# Patient Record
Sex: Female | Born: 1950 | Hispanic: No | Marital: Married | Smoking: Former smoker
Health system: Southern US, Community
[De-identification: ages and names within clinical notes are randomized; demographics above are authoritative.]

## PROBLEM LIST (undated history)

## (undated) DIAGNOSIS — L816 Other disorders of diminished melanin formation: Secondary | ICD-10-CM

## (undated) DIAGNOSIS — K635 Polyp of colon: Secondary | ICD-10-CM

## (undated) DIAGNOSIS — F329 Major depressive disorder, single episode, unspecified: Secondary | ICD-10-CM

## (undated) DIAGNOSIS — Z780 Asymptomatic menopausal state: Secondary | ICD-10-CM

## (undated) DIAGNOSIS — F32A Depression, unspecified: Secondary | ICD-10-CM

## (undated) DIAGNOSIS — M8580 Other specified disorders of bone density and structure, unspecified site: Secondary | ICD-10-CM

## (undated) DIAGNOSIS — IMO0002 Reserved for concepts with insufficient information to code with codable children: Secondary | ICD-10-CM

## (undated) DIAGNOSIS — R011 Cardiac murmur, unspecified: Secondary | ICD-10-CM

## (undated) DIAGNOSIS — F419 Anxiety disorder, unspecified: Secondary | ICD-10-CM

## (undated) HISTORY — DX: Asymptomatic menopausal state: Z78.0

## (undated) HISTORY — DX: Major depressive disorder, single episode, unspecified: F32.9

## (undated) HISTORY — DX: Other disorders of diminished melanin formation: L81.6

## (undated) HISTORY — DX: Cardiac murmur, unspecified: R01.1

## (undated) HISTORY — PX: COLONOSCOPY: SHX174

## (undated) HISTORY — PX: TONSILLECTOMY: SUR1361

## (undated) HISTORY — DX: Anxiety disorder, unspecified: F41.9

## (undated) HISTORY — DX: Other specified disorders of bone density and structure, unspecified site: M85.80

## (undated) HISTORY — PX: HEMORRHOID SURGERY: SHX153

## (undated) HISTORY — DX: Polyp of colon: K63.5

## (undated) HISTORY — DX: Reserved for concepts with insufficient information to code with codable children: IMO0002

## (undated) HISTORY — DX: Depression, unspecified: F32.A

---

## 1998-12-31 ENCOUNTER — Other Ambulatory Visit: Admission: RE | Admit: 1998-12-31 | Discharge: 1998-12-31 | Payer: Self-pay | Admitting: Gynecology

## 2000-02-13 ENCOUNTER — Other Ambulatory Visit: Admission: RE | Admit: 2000-02-13 | Discharge: 2000-02-13 | Payer: Self-pay | Admitting: Gynecology

## 2001-03-09 ENCOUNTER — Other Ambulatory Visit: Admission: RE | Admit: 2001-03-09 | Discharge: 2001-03-09 | Payer: Self-pay | Admitting: Gynecology

## 2002-04-12 ENCOUNTER — Other Ambulatory Visit: Admission: RE | Admit: 2002-04-12 | Discharge: 2002-04-12 | Payer: Self-pay | Admitting: Gynecology

## 2008-10-29 ENCOUNTER — Encounter: Admission: RE | Admit: 2008-10-29 | Discharge: 2008-10-29 | Payer: Self-pay | Admitting: Family Medicine

## 2009-12-09 LAB — LIPID PANEL: HDL: 92 mg/dL — AB (ref 35–70)

## 2011-03-22 LAB — POCT ERYTHROCYTE SEDIMENTATION RATE, NON-AUTOMATED: Sed Rate: 15 mm

## 2011-03-23 ENCOUNTER — Other Ambulatory Visit: Payer: Self-pay | Admitting: Family Medicine

## 2011-03-23 DIAGNOSIS — N189 Chronic kidney disease, unspecified: Secondary | ICD-10-CM

## 2011-03-26 ENCOUNTER — Ambulatory Visit
Admission: RE | Admit: 2011-03-26 | Discharge: 2011-03-26 | Disposition: A | Discharge status: Home / Self Care | Payer: BC Managed Care – PPO | Source: Ambulatory Visit | Attending: Family Medicine | Admitting: Family Medicine

## 2011-03-26 DIAGNOSIS — N189 Chronic kidney disease, unspecified: Secondary | ICD-10-CM

## 2011-03-27 ENCOUNTER — Other Ambulatory Visit: Payer: Self-pay

## 2011-10-15 ENCOUNTER — Ambulatory Visit (INDEPENDENT_AMBULATORY_CARE_PROVIDER_SITE_OTHER): Payer: BC Managed Care – PPO

## 2011-10-15 DIAGNOSIS — R1084 Generalized abdominal pain: Secondary | ICD-10-CM

## 2011-10-16 ENCOUNTER — Other Ambulatory Visit: Payer: Self-pay | Admitting: Family Medicine

## 2011-10-16 ENCOUNTER — Ambulatory Visit
Admission: RE | Admit: 2011-10-16 | Discharge: 2011-10-16 | Disposition: A | Discharge status: Home / Self Care | Payer: BC Managed Care – PPO | Source: Ambulatory Visit | Attending: Family Medicine | Admitting: Family Medicine

## 2011-10-16 ENCOUNTER — Ambulatory Visit (INDEPENDENT_AMBULATORY_CARE_PROVIDER_SITE_OTHER): Payer: BC Managed Care – PPO

## 2011-10-16 DIAGNOSIS — K921 Melena: Secondary | ICD-10-CM

## 2011-10-16 DIAGNOSIS — K5900 Constipation, unspecified: Secondary | ICD-10-CM

## 2011-10-16 DIAGNOSIS — K310 Acute dilatation of stomach: Secondary | ICD-10-CM

## 2011-10-16 LAB — CBC AND DIFFERENTIAL: WBC: 4.9 10^3/mL

## 2011-10-16 MED ORDER — IOHEXOL 300 MG/ML  SOLN
100.0000 mL | Freq: Once | INTRAMUSCULAR | Status: AC | PRN
  Administered 2011-10-16: 100 mL via INTRAVENOUS

## 2011-10-17 LAB — HEPATIC FUNCTION PANEL
ALT: 24 U/L (ref 7–35)
AST: 26 U/L (ref 13–35)

## 2011-10-17 LAB — BASIC METABOLIC PANEL: Potassium: 3.9 mmol/L (ref 3.4–5.3)

## 2011-10-26 ENCOUNTER — Ambulatory Visit
Admission: RE | Admit: 2011-10-26 | Discharge: 2011-10-26 | Disposition: A | Discharge status: Home / Self Care | Payer: BC Managed Care – PPO | Source: Ambulatory Visit | Attending: Gastroenterology | Admitting: Gastroenterology

## 2011-10-26 ENCOUNTER — Other Ambulatory Visit: Payer: Self-pay | Admitting: Gastroenterology

## 2011-10-26 DIAGNOSIS — A09 Infectious gastroenteritis and colitis, unspecified: Secondary | ICD-10-CM

## 2011-10-26 DIAGNOSIS — R9389 Abnormal findings on diagnostic imaging of other specified body structures: Secondary | ICD-10-CM

## 2011-11-05 ENCOUNTER — Encounter: Payer: Self-pay | Admitting: Family Medicine

## 2011-11-05 ENCOUNTER — Ambulatory Visit (INDEPENDENT_AMBULATORY_CARE_PROVIDER_SITE_OTHER): Payer: BC Managed Care – PPO | Admitting: Family Medicine

## 2011-11-05 DIAGNOSIS — B029 Zoster without complications: Secondary | ICD-10-CM | POA: Insufficient documentation

## 2011-11-05 DIAGNOSIS — S60212A Contusion of left wrist, initial encounter: Secondary | ICD-10-CM

## 2011-11-05 DIAGNOSIS — L816 Other disorders of diminished melanin formation: Secondary | ICD-10-CM | POA: Insufficient documentation

## 2011-11-05 DIAGNOSIS — Z780 Asymptomatic menopausal state: Secondary | ICD-10-CM | POA: Insufficient documentation

## 2011-11-05 DIAGNOSIS — H9319 Tinnitus, unspecified ear: Secondary | ICD-10-CM | POA: Insufficient documentation

## 2011-11-05 DIAGNOSIS — IMO0002 Reserved for concepts with insufficient information to code with codable children: Secondary | ICD-10-CM | POA: Insufficient documentation

## 2011-11-05 DIAGNOSIS — F419 Anxiety disorder, unspecified: Secondary | ICD-10-CM | POA: Insufficient documentation

## 2011-11-05 DIAGNOSIS — D72819 Decreased white blood cell count, unspecified: Secondary | ICD-10-CM

## 2011-11-05 DIAGNOSIS — F411 Generalized anxiety disorder: Secondary | ICD-10-CM

## 2011-11-05 DIAGNOSIS — Z888 Allergy status to other drugs, medicaments and biological substances status: Secondary | ICD-10-CM

## 2011-11-05 DIAGNOSIS — K635 Polyp of colon: Secondary | ICD-10-CM | POA: Insufficient documentation

## 2011-11-05 DIAGNOSIS — S60219A Contusion of unspecified wrist, initial encounter: Secondary | ICD-10-CM

## 2011-11-05 DIAGNOSIS — T50905A Adverse effect of unspecified drugs, medicaments and biological substances, initial encounter: Secondary | ICD-10-CM

## 2011-11-05 DIAGNOSIS — R011 Cardiac murmur, unspecified: Secondary | ICD-10-CM | POA: Insufficient documentation

## 2011-11-05 MED ORDER — SERTRALINE HCL 50 MG PO TABS: 50.0000 mg | ORAL_TABLET | Freq: Every day | ORAL | Status: DC

## 2011-11-05 NOTE — Patient Instructions (Signed)
Continue current medication. Follow-up with Dermatologist as planned to discuss possible side effect related to Plaquenil. Sertraline (Zoloft) has been refilled and routed to your pharmacy.   Wrist Pain Wrist injuries are frequent in adults and children. A sprain is an injury to the ligaments that hold your bones together. A strain is an injury to muscle or muscle cord-like structures (tendons) from stretching or pulling. Generally, when wrists are moderately tender to touch following a fall or injury, a break in the bone (fracture) may be present. Most wrist sprains or strains are better in 3 to 5 days, but complete healing may take several weeks. HOME CARE INSTRUCTIONS   Put ice on the injured area.   Put ice in a plastic bag.   Place a towel between your skin and the bag.   Leave the ice on for 15 to 20 minutes, 3 to 4 times a day, for the first 2 days.   Keep your arm raised above the level of your heart whenever possible to reduce swelling and pain.   Rest the injured area for at least 48 hours or as directed by your caregiver.   If a splint or elastic bandage has been applied, use it for as long as directed by your caregiver or until seen by a caregiver for a follow-up exam.   Only take over-the-counter or prescription medicines for pain, discomfort, or fever as directed by your caregiver.   Keep all follow-up appointments. You may need to follow up with a specialist or have follow-up X-rays. Improvement in pain level is not a guarantee that you did not fracture a bone in your wrist. The only way to determine whether or not you have a broken bone is by X-ray.  SEEK IMMEDIATE MEDICAL CARE IF:   Your fingers are swollen, very red, white, or cold and blue.   Your fingers are numb or tingling.   You have increasing pain.   You have difficulty moving your fingers.  MAKE SURE YOU:   Understand these instructions.   Will watch your condition.   Will get help right away if you  are not doing well or get worse.  Document Released: 07/01/2005 Document Revised: 06/03/2011 Document Reviewed: 11/12/2010 [facility] Patient Information 2012 [place], [place].

## 2011-11-05 NOTE — Progress Notes (Signed)
Subjective:    Patient ID: Rebecca [name], female    DOB: 06/09/1951, 61 y.o.   MRN: [id number]  HPI   This 12 y.Mar[place] female is here for follow-up re: Anxiety for which she takes Sertraline 50 mg             daily. This medication is working well for her in addition to counseling and better management of           time ( work-life balance). She no longer takes Alprazolam which she was using only for mild,        Intermittent insomnia. Her main concern now is with some GI problems that were initially         evaluated on 10/16/11 at [facility] (abd. pain and diarrhea) which the pt thinks is due to         Plaquenil which she takes for a skin disorder. She has since been to Dr. Ewing Schlein for GI follow-up        and has contacted her Dermatologist , Dr. Isaac Laud, via e-mail to clarify what her dosage         should be because she has reduced Plaquenil by half. She discontinued this med for several days        immediately after her 10/16/11 evaluation at [facility] and felt much better with almost complete        resolution of the abd pain and diarrhea. She has a recent print out of CBC done at recent visit        at [facility] and it shows a low WBC (3.9); her physician reassured her that this was not signi-        ficant but she is concerned about it and plans to review with Dermatologist.        The pt also had a fall a few days ago and has bruising and mild left wrist pain but doubts that she        has a fracture. She notes that she stretched out her arm to break her fall but did not seek medical        attention at the time.                  Review of Systems  Constitutional: Negative.   Cardiovascular: Negative.   Gastrointestinal: Negative.   Psychiatric/Behavioral: Negative.   See HPI re: Psychiatric details.     Objective:   Physical Exam  Vitals reviewed. Constitutional: She is oriented to person, place, and time. She appears well-developed and well-nourished.  HENT:  Head:  Normocephalic and atraumatic.  Pulmonary/Chest: Effort normal.  Musculoskeletal: Normal range of motion. She exhibits no edema and no tenderness.       Left wrist is discolored with bruising; nontender over bony structures. Grip is good.  Neurological: She is alert and oriented to person, place, and time.  Skin: Skin is warm and dry.       Hyperpigmentation with reticular pattern particularly in facial area is stable  Psychiatric: She has a normal mood and affect. Her behavior is normal. Judgment and thought content normal.   LABS:10/28/2011 at [facility]       WBC 3.9       Hgb   11.7    Hct   34.7       plts    191 K       CMET   Normal (K+= 3.7)      Assessment &  Plan:   1. Anxiety disorder - improved and stable. Continue Sertraline; RF x 4 months.  2. Leukopenia - pt given WBC values from prior visits at [facility]; to discuss with DERM.   3. Medication side effects present - suspect secondary to Plaquenil; symptoms improved when medication was held for few days. Pt resumed med at lower dose.  4. Contusion of wrist, left - pt instructions given.    RTC in 4 months or sooner prn.

## 2011-11-06 ENCOUNTER — Encounter: Payer: Self-pay | Admitting: Family Medicine

## 2011-11-12 ENCOUNTER — Telehealth: Payer: Self-pay

## 2011-11-12 NOTE — Telephone Encounter (Signed)
.  [facility] BARBARA FROM DR MAGOOD'S OFFICE STATES PT WAS TO HAVE A COLONOSPY DONE AND SHE CANCELLED.   Bonita Quin MAY REACH BARBARA AT [phone] IF NEEDED

## 2011-11-12 NOTE — Telephone Encounter (Signed)
We will wait for the patient to reschedule her colonoscopy since she says she is planning on doing so.

## 2011-11-12 NOTE — Telephone Encounter (Signed)
SPOKE WITH BARBARA, PT CANCELLED COLONOSCOPY AND DIDN'T GIVE REASON WHY. CALLED PT AND SHE STATES SHE HAS TOO MUCH GOING ON AND THE MEDS ARE CAUSING HER A LOT OF GI UPSET. SHE DOESN'T WANT TO GO THROUGH THE PREP RIGHT NOW BUT PLANS TO RESCHEDULE. FYI TO DR HOPPER

## 2012-01-29 ENCOUNTER — Ambulatory Visit (INDEPENDENT_AMBULATORY_CARE_PROVIDER_SITE_OTHER): Payer: BC Managed Care – PPO | Admitting: Family Medicine

## 2012-01-29 VITALS — BP 111/72 | HR 83 | Temp 97.2°F | Resp 16 | Ht 68.0 in | Wt 143.8 lb

## 2012-01-29 DIAGNOSIS — N760 Acute vaginitis: Secondary | ICD-10-CM

## 2012-01-29 DIAGNOSIS — G4700 Insomnia, unspecified: Secondary | ICD-10-CM

## 2012-01-29 LAB — POCT WET PREP WITH KOH
KOH Prep POC: NEGATIVE
Trichomonas, UA: NEGATIVE

## 2012-01-29 MED ORDER — ALPRAZOLAM 0.25 MG PO TABS: ORAL_TABLET | ORAL | Status: DC

## 2012-01-29 MED ORDER — METRONIDAZOLE 0.75 % VA GEL: VAGINAL | Status: DC

## 2012-01-29 NOTE — Patient Instructions (Signed)
Bacterial Vaginosis Bacterial vaginosis (BV) is a vaginal infection where the normal balance of bacteria in the vagina is disrupted. The normal balance is then replaced by an overgrowth of certain bacteria. There are several different kinds of bacteria that can cause BV. BV is the most common vaginal infection in women of childbearing age. CAUSES   The cause of BV is not fully understood. BV develops when there is an increase or imbalance of harmful bacteria.   Some activities or behaviors can upset the normal balance of bacteria in the vagina and put women at increased risk including:   Having a new sex partner or multiple sex partners.   Douching.   Using an intrauterine device (IUD) for contraception.   It is not clear what role sexual activity plays in the development of BV. However, women that have never had sexual intercourse are rarely infected with BV.  Women do not get BV from toilet seats, bedding, swimming pools or from touching objects around them.  SYMPTOMS   Grey vaginal discharge.   A fish-like odor with discharge, especially after sexual intercourse.   Itching or burning of the vagina and vulva.   Burning or pain with urination.   Some women have no signs or symptoms at all.  DIAGNOSIS  Your caregiver must examine the vagina for signs of BV. Your caregiver will perform lab tests and look at the sample of vaginal fluid through a microscope. They will look for bacteria and abnormal cells (clue cells), a pH test higher than 4.5, and a positive amine test all associated with BV.  RISKS AND COMPLICATIONS   Pelvic inflammatory disease (PID).   Infections following gynecology surgery.   Developing HIV.   Developing herpes virus.  TREATMENT  Sometimes BV will clear up without treatment. However, all women with symptoms of BV should be treated to avoid complications, especially if gynecology surgery is planned. Female partners generally do not need to be treated. However,  BV may spread between female sex partners so treatment is helpful in preventing a recurrence of BV.   BV may be treated with antibiotics. The antibiotics come in either pill or vaginal cream forms. Either can be used with nonpregnant or pregnant women, but the recommended dosages differ. These antibiotics are not harmful to the baby.   BV can recur after treatment. If this happens, a second round of antibiotics will often be prescribed.   Treatment is important for pregnant women. If not treated, BV can cause a premature delivery, especially for a pregnant woman who had a premature birth in the past. All pregnant women who have symptoms of BV should be checked and treated.   For chronic reoccurrence of BV, treatment with a type of prescribed gel vaginally twice a week is helpful.  HOME CARE INSTRUCTIONS   Finish all medication as directed by your caregiver.   Do not have sex until treatment is completed.   Tell your sexual partner that you have a vaginal infection. They should see their caregiver and be treated if they have problems, such as a mild rash or itching.   Practice safe sex. Use condoms. Only have 1 sex partner.  PREVENTION  Basic prevention steps can help reduce the risk of upsetting the natural balance of bacteria in the vagina and developing BV:  Do not have sexual intercourse (be abstinent).   Do not douche.   Use all of the medicine prescribed for treatment of BV, even if the signs and symptoms go away.     Tell your sex partner if you have BV. That way, they can be treated, if needed, to prevent reoccurrence.  SEEK MEDICAL CARE IF:   Your symptoms are not improving after 3 days of treatment.   You have increased discharge, pain, or fever.  MAKE SURE YOU:   Understand these instructions.   Will watch your condition.   Will get help right away if you are not doing well or get worse.  FOR MORE INFORMATION  Division of STD Prevention (DSTDP), Centers for Disease  Control and Prevention: www.cdc.gov/std American Social Health Association (ASHA): www.ashastd.org  Document Released: 09/21/2005 Document Revised: 09/10/2011 Document Reviewed: 03/14/2009 [employer] Patient Information 2012 [employer]. 

## 2012-01-29 NOTE — Progress Notes (Signed)
  Subjective:    Patient ID: Rebecca [name], female    DOB: 1951/03/18, 61 y.o.   MRN: [id number]  HPI  This pleasant 61 y.o. Woman is here to have vag discharge evaluated. She had been on chronic   meds for skin condition but found that it caused significant GI problems so she stopped it about 2 months  ago. She will f/u with the specialist responsible for prescribing Plaquenil next months. She also stopped   taking Sertraline (weaned off by taking 1 tab every other day) about 1 month ago. She feel good; still  having some stressors with family and work. She is still seeing a psychologist and learning to set boundaries and say "No"  to prevent being overwhelmed. She c/o insomnia with early awakenings at 3 AM and trouble getting back to sleep.  She is flying out to [place] in AM to help brother with some problems and is concerned about lack of sleep.   Review of Systems As per HPI     Objective:   Physical Exam  Vitals reviewed. Constitutional: She is oriented to person, place, and time. She appears well-developed and well-nourished. No distress.  HENT:  Head: Normocephalic and atraumatic.  Cardiovascular: Normal rate.   Pulmonary/Chest: Effort normal. No respiratory distress.  Genitourinary: Vaginal discharge found.  Neurological: She is alert and oriented to person, place, and time. No cranial nerve deficit.  Psychiatric: She has a normal mood and affect. Her behavior is normal. Judgment and thought content normal.    Results for orders placed in visit on 01/29/12  POCT WET PREP WITH KOH      Component Value Range   Trichomonas, UA Negative     Clue Cells Wet Prep HPF POC neg     Epithelial Wet Prep HPF POC 0-10     Yeast Wet Prep HPF POC neg     Bacteria Wet Prep HPF POC 1+     RBC Wet Prep HPF POC 0-2     WBC Wet Prep HPF POC 0-2     KOH Prep POC Negative           Assessment & Plan:   1. Insomnia  ALPRAZolam (XANAX) 0.25 MG tablet  1/2 to 1 tab hs as needed for sleep.   2. Vaginitis  RX:  Metrogel vag   Use as directed   Pt given copy of Anti-Inflammatory Diet; Follow-up with DERM at [facility] in May 2013

## 2012-01-30 ENCOUNTER — Encounter: Payer: Self-pay | Admitting: Family Medicine

## 2012-04-23 ENCOUNTER — Ambulatory Visit (INDEPENDENT_AMBULATORY_CARE_PROVIDER_SITE_OTHER): Payer: BC Managed Care – PPO | Admitting: Family Medicine

## 2012-04-23 VITALS — BP 122/74 | HR 72 | Temp 98.8°F | Resp 16 | Ht 68.0 in | Wt 146.0 lb

## 2012-04-23 DIAGNOSIS — G4700 Insomnia, unspecified: Secondary | ICD-10-CM

## 2012-04-23 DIAGNOSIS — F411 Generalized anxiety disorder: Secondary | ICD-10-CM

## 2012-04-23 DIAGNOSIS — F419 Anxiety disorder, unspecified: Secondary | ICD-10-CM

## 2012-04-23 DIAGNOSIS — N949 Unspecified condition associated with female genital organs and menstrual cycle: Secondary | ICD-10-CM

## 2012-04-23 DIAGNOSIS — N898 Other specified noninflammatory disorders of vagina: Secondary | ICD-10-CM

## 2012-04-23 LAB — POCT WET PREP WITH KOH
KOH Prep POC: NEGATIVE
Trichomonas, UA: NEGATIVE
Yeast Wet Prep HPF POC: NEGATIVE

## 2012-04-23 MED ORDER — ALPRAZOLAM 0.25 MG PO TABS: 0.2500 mg | ORAL_TABLET | Freq: Three times a day (TID) | ORAL | Status: DC | PRN

## 2012-04-23 MED ORDER — FLUOXETINE HCL 20 MG PO TABS: 20.0000 mg | ORAL_TABLET | Freq: Every day | ORAL | Status: DC

## 2012-04-23 NOTE — Progress Notes (Signed)
  Subjective:    Patient ID: Rebecca [name], female    DOB: [birth year], 61 y.o.   MRN: [id number]  HPI Rebecca [name] is a 61 y.o. female  Vaginal discharge - treated with Metrogel in April. Seemed to work, but came back with discharge - past 1 -2 months. No itching, no burning.  Darker color.  Slight odor.  Menopause in mid 50's.  No postmenopausal bleeding.  TX: none.  No recent antibiotics.  Sexually active - same partner for 30 years.  Last pap normal recently?   Anxiety - on Zoloft 50mg  QD. Started in October 2012. 3 pound weight gain.  Felt better in February - stopped on own. Has not been on this since February.  Feels anxious with issues recently, had seen therapist. Getting back to see therapist.  Doesn't want to slip back into depression. No suicide thoughts. More anxious than depressed.   Needs rx for bane scan at time of mammogram - last one 2 years ago?   [facility].    Review of Systems     Objective:   Physical Exam  Constitutional: She appears well-developed and well-nourished.  HENT:  Head: Normocephalic and atraumatic.  Neck: No thyromegaly present.  Cardiovascular: Normal rate, regular rhythm, normal heart sounds and intact distal pulses.   Pulmonary/Chest: Effort normal.  Abdominal: Soft. There is no tenderness.  Genitourinary: Pelvic exam was performed with patient supine. There is no rash or lesion on the right labia. There is no rash or lesion on the left labia. No bleeding around the vagina. No foreign body around the vagina. Vaginal discharge (small amount of white discharge in vault. ) found.  Skin: Skin is warm and dry.  Psychiatric: She has a normal mood and affect. Her behavior is normal. Judgment and thought content normal. She expresses no suicidal ideation.     Results for orders placed in visit on 04/23/12  POCT WET PREP WITH KOH      Component Value Range   Trichomonas, UA Negative     Clue Cells Wet Prep HPF POC neg     Epithelial Wet Prep HPF POC 4-6     Yeast Wet Prep HPF POC neg     Bacteria Wet Prep HPF POC trace     RBC Wet Prep HPF POC neg     WBC Wet Prep HPF POC 2-8     KOH Prep POC Negative          Assessment & Plan:  Rebecca [name] is a 61 y.o. female  Vaginal discharge - likely physiologic.  Reassurance provided.  Can recheck in few weeks with primary provider, sooner if worsening.   Anxiety - with hx depression, early recurrence of meds.  Encouraged to restart counselling.  Will try prozac 20mg  qd as change in meds requested. SED. Xanax 0.25mg  tid prn temporarily. Needs to recheck in next 3 weeks with Dr. Audria Nine.    Paper Rx for bone density testing provided, but may need to check with insurance to determine if this is covered.  Also encouraged CPE with primary provider.

## 2012-04-23 NOTE — Patient Instructions (Signed)
Recheck with Dr. Audria Nine in few weeks. Return to the clinic or go to the nearest emergency room if any of your symptoms worsen or new symptoms occur. Check with [facility] and possibly your insurance company to make sure the bone density test is covered.

## 2012-05-11 ENCOUNTER — Ambulatory Visit (INDEPENDENT_AMBULATORY_CARE_PROVIDER_SITE_OTHER): Payer: BC Managed Care – PPO | Admitting: Family Medicine

## 2012-05-11 VITALS — BP 133/77 | HR 66 | Temp 98.1°F | Resp 16 | Ht 68.0 in | Wt 145.0 lb

## 2012-05-11 DIAGNOSIS — N951 Menopausal and female climacteric states: Secondary | ICD-10-CM

## 2012-05-11 DIAGNOSIS — F341 Dysthymic disorder: Secondary | ICD-10-CM

## 2012-05-11 DIAGNOSIS — L816 Other disorders of diminished melanin formation: Secondary | ICD-10-CM

## 2012-05-11 DIAGNOSIS — F418 Other specified anxiety disorders: Secondary | ICD-10-CM

## 2012-05-11 DIAGNOSIS — L819 Disorder of pigmentation, unspecified: Secondary | ICD-10-CM

## 2012-05-11 MED ORDER — VENLAFAXINE HCL 37.5 MG PO TABS: 37.5000 mg | ORAL_TABLET | Freq: Two times a day (BID) | ORAL | Status: DC

## 2012-05-12 NOTE — Progress Notes (Signed)
S; This 61 y.o. Female has depression and anxiety treated with Fluoxetine. She also has menopause symptoms- hot flushing and night sweats as well as exacerbation of depressive symptoms. She takes generic Prozac as prescribed by Dr Herma Carson last month and uses Alprazolam only as needed. She was seen at [facility] last month for evaluation of vaginal discharge and ongoing depression was addressed. She is in counseling and continues to address issues pertaining to management of work-related stress and familial obligations. She also continues to see Derm specialist at [facility] and takes prescribed medication but does not see any significant improvement in facial skin appearance. She is feeling a little disheartened. She has no thoughts of self-harm; husband is very supportive.  ROS: As per HPI otherwise, noncontributory.   O:  Filed Vitals:   05/11/12 1041  BP: 133/77  Pulse: 66  Temp: 98.1 F (36.7 C)  Resp: 16   GEN: In NAD; WN,WD. HENT: [place]/AT; EOMI; conj/sclerae clear. NECK: No thyromegaly COR: RRR LUNGS: Normal resp rate and effort. NEURO: A & O x 3; otherwise nonfocal; mental status- pt has a pleasant demeanor an does not appear anxious or depressed. SKIN: Facial area- diffuse spotchy hyperpigmentation   A/P:     1. Depression with anxiety - continue counseling; discontinue Fluoxetine. Pt willing to try generic Effexor 37.5 mg- maintenance dose will be 1 tablet twice a day until follow-up  2. 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

## 2012-05-24 ENCOUNTER — Encounter: Payer: Self-pay | Admitting: Family Medicine

## 2012-06-22 ENCOUNTER — Ambulatory Visit (INDEPENDENT_AMBULATORY_CARE_PROVIDER_SITE_OTHER): Payer: BC Managed Care – PPO | Admitting: Family Medicine

## 2012-06-22 ENCOUNTER — Encounter: Payer: Self-pay | Admitting: Family Medicine

## 2012-06-22 VITALS — BP 118/74 | HR 87 | Temp 97.4°F | Resp 16 | Ht 68.0 in | Wt 145.0 lb

## 2012-06-22 DIAGNOSIS — Z780 Asymptomatic menopausal state: Secondary | ICD-10-CM

## 2012-06-22 DIAGNOSIS — F419 Anxiety disorder, unspecified: Secondary | ICD-10-CM

## 2012-06-22 DIAGNOSIS — F411 Generalized anxiety disorder: Secondary | ICD-10-CM

## 2012-06-22 DIAGNOSIS — N952 Postmenopausal atrophic vaginitis: Secondary | ICD-10-CM

## 2012-06-22 MED ORDER — VENLAFAXINE HCL 37.5 MG PO TABS: 37.5000 mg | ORAL_TABLET | Freq: Two times a day (BID) | ORAL | Status: DC

## 2012-06-22 MED ORDER — ESTROGENS, CONJUGATED 0.625 MG/GM VA CREA: TOPICAL_CREAM | VAGINAL | Status: DC

## 2012-06-22 NOTE — Progress Notes (Signed)
S:  This 61 y.o. Menopausal female returns today for follow-up after medication change (Fluoxetine discontinued and Venlafaxine started). Pt reports that she feels better ("calmer"), more focused and resting better with ~ 50% less hot flushes. She feels like she is in a better place and state of mind; she also realizes that she was having an alcoholic beverage (vodka) every PM after work and decided to discontinue this in Nov 2012. She now has 1 glass of wine with meals on the weekend. She discussed her alcohol use with her counselor who advised her about deleterious effects of alcohol of women as compared to men; she is aware that she was probably using alcohol to "self-medicate". Her family hx includes alcohol dependence.  She feels like she has reached maximum benefit from counseling at this point but can call her therapist  If she needs to return. She feels that she is managing work and stress better. Husband is very supportive; he has been "bugging" her to get a full physical exam.   She continues to have vaginal dryness and OTC lubricants are not effective. She has been reading about Premarin vaginal cream and would like to try this product. She wants to avoid the systemic effect of HRT.  ROS: As per HPI; no CP or tightness, HAs, SOB, abd pain or jaundice, vaginal bleeding or discharge.   OCeasar Mons Vitals:   06/22/12 0805  BP: 118/74  Pulse: 87  Temp: 97.4 F (36.3 C)  Resp: 16   GEN: In NAD; WN,WD. HENT: [place]/AT; EOMI, conj/scl clear and nonicteric. COR: RRR. LUNGS: Normal resp rate and effort. SKIN: W&D; no rashes, paleness or jaundice; hyperpigmentation again noted on facial area. NEURO: A&O x 3; CNs intact; Mentation- very pleasant demeanor with happy affect; attentive and engaged in conversation.   A/P: 1. Anxiety   Great results with Venlafaxine 37.5 mg bid; pt is content to remain at this dose Continue to use Alprazolam as needed hs for sleep.  2. Menopause  Continue  Venlafaxine and reduced alcohol intake  3. Postmenopause atrophic vaginitis    RX: Premarin vag cream  1/4 to 1/2 applicatorful hs

## 2012-12-28 ENCOUNTER — Ambulatory Visit: Payer: BC Managed Care – PPO | Admitting: Family Medicine

## 2013-01-01 ENCOUNTER — Ambulatory Visit: Payer: BC Managed Care – PPO | Admitting: Emergency Medicine

## 2013-01-01 VITALS — BP 130/85 | HR 82 | Temp 98.0°F | Resp 16 | Ht 68.0 in | Wt 149.8 lb

## 2013-01-01 DIAGNOSIS — F4323 Adjustment disorder with mixed anxiety and depressed mood: Secondary | ICD-10-CM

## 2013-01-01 DIAGNOSIS — Z780 Asymptomatic menopausal state: Secondary | ICD-10-CM

## 2013-01-01 MED ORDER — VENLAFAXINE HCL 37.5 MG PO TABS: ORAL_TABLET | ORAL | Status: DC

## 2013-01-01 NOTE — Progress Notes (Signed)
[facility] [address], [place] [address] [phone]  Date:  01/01/2013   Name:  Rebecca [name]   DOB:  10-23-1950   MRN:  [id number]  PCP:  No primary provider on file.    Chief Complaint: Medication Refill   History of Present Illness:  Rebecca [name] is a 62 y.o. very pleasant female patient who presents with the following:  History of depression and anxiety disorder and has increased symptoms.  She increased her effexor dose from 75 to 112.5 (extra 37.5) as she was having increased night sweats.  She is now unable to refill her medication as she has run out early.  No improvement with over the counter medications or other home remedies. Denies other complaint or health concern today.   Patient Active Problem List  Diagnosis  . Menopause  . Murmur  . Poikiloderma  . Herpes zoster  . Degenerative disk disease  . Tinnitus  . Colon polyp  . Anxiety disorder    Past Medical History  Diagnosis Date  . Menopause   . Murmur   . Osteopenia   . Poikiloderma   . Herpes zoster     2011  . Degenerative disk disease     C5-C6  . Tinnitus     L ear  . Colon polyp     2007    Past Surgical History  Procedure Laterality Date  . Hemorrhoid surgery      History  Substance Use Topics  . Smoking status: Former Smoker -- 1.00 packs/day    Types: Cigarettes    Quit date: 11/04/1978  . Smokeless tobacco: Not on file  . Alcohol Use: Not on file    History reviewed. No pertinent family history.  Allergies  Allergen Reactions  . Codeine Hives    Medication list has been reviewed and updated.  Current Outpatient Prescriptions on File Prior to Visit  Medication Sig Dispense Refill  . ALPRAZolam (XANAX) 0.25 MG tablet Take 1 tablet (0.25 mg total) by mouth 3 (three) times daily as needed for sleep or anxiety.  30 tablet  0  . conjugated estrogens (PREMARIN) vaginal cream Use 1/4 to 1/2 applicatorful hs  42.5 g  5  . venlafaxine (EFFEXOR) 37.5 MG  tablet Take 1 tablet (37.5 mg total) by mouth 2 (two) times daily.  60 tablet  5   No current facility-administered medications on file prior to visit.    Review of Systems:  As per HPI, otherwise negative.    Physical Examination: Filed Vitals:   01/01/13 0908  BP: 130/85  Pulse: 82  Temp: 98 F (36.7 C)  Resp: 16   Filed Vitals:   01/01/13 0908  Height: 5\' 8"  (1.727 m)  Weight: 149 lb 12.8 oz (67.949 kg)   Body mass index is 22.78 kg/(m^2). Ideal Body Weight: Weight in (lb) to have BMI = 25: 164.1   GEN: WDWN, NAD, Non-toxic, Alert & Oriented x 3 HEENT: Atraumatic, Normocephalic.  Ears and Nose: No external deformity. EXTR: No clubbing/cyanosis/edema NEURO: Normal gait.  PSYCH: Normally interactive. Conversant. Not depressed or anxious appearing.  Calm demeanor.    Assessment and Plan: Depression and anxiety disorder Menopause Refill med Follow up this week with Dr Audria Nine   Signed,  Phillips Odor, MD

## 2013-01-04 ENCOUNTER — Encounter: Payer: Self-pay | Admitting: Family Medicine

## 2013-01-04 ENCOUNTER — Ambulatory Visit (INDEPENDENT_AMBULATORY_CARE_PROVIDER_SITE_OTHER): Payer: BC Managed Care – PPO | Admitting: Family Medicine

## 2013-01-04 VITALS — BP 124/72 | HR 82 | Temp 98.4°F | Resp 16 | Ht 68.0 in | Wt 147.0 lb

## 2013-01-04 DIAGNOSIS — Z760 Encounter for issue of repeat prescription: Secondary | ICD-10-CM

## 2013-01-04 DIAGNOSIS — F4323 Adjustment disorder with mixed anxiety and depressed mood: Secondary | ICD-10-CM

## 2013-01-04 MED ORDER — VENLAFAXINE HCL 37.5 MG PO TABS: ORAL_TABLET | ORAL | Status: DC

## 2013-01-06 NOTE — Progress Notes (Addendum)
S:  This 62 y.o. Cauc female returns for medication review and refills. She had brief evaluation at 102 on 01/01/13 when night sweats and depressive/ anxiety symptoms increased. She added an extra dose of Effexor 37.5 mg at bedtime and subsequently ran out of medication early. Dr. Dareen Piano authorized a 62-month refill. 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 She has been dealing w/ the transition and is now embarking on a partnership w/ another Buyer, retail. The adjustment to being an independent business woman has been anxiety-provoking; pt has several women in the design industry who serve as support persons and mentors. She feels happier overall. She continues in counseling and feels that she has made the right decision. Increasing Effexor dose has been effective but she is concerned that one of the side effects of the medication is sweating.  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 She continues follow-up at [facility] in [place], [place].  ROS: As per HPI; negative for fatigue, fever/chills, unexplained weight change, CP or tightness, palpitations, SOB or DOE, musculoskeletal problems, HA or dizziness, increased sleep disturbance, agitation or behavior change, concentration difficulties or thoughts of self harm.   Patient Active Problem List  Diagnosis  . Menopause  . Murmur  . Poikiloderma  (noow diagnosis is LPP  . Herpes zoster  . Degenerative disk disease  . Tinnitus  . Colon polyp  . Anxiety disorder    O:  Filed Vitals:   01/04/13 0959  BP: 124/72  Pulse: 82  Temp: 98.4 F (36.9 C)  Resp: 16   GEN: In NAD; WN,WD. HENT: [place]/AT. EOMI w/ clear conj/sclerae. Otherwise normal. COR: RRR. LUNGS: Normal resp rate and effort. NEURO: A&O x 3; CNs intact. Pleasant relaxed demeanor. Nonfocal.  A/P: Adjustment disorder with mixed anxiety and depressed mood- continue  Venlafaxine 37.5 mg  2 tabs every AM and 1 tab at bedtime.  Medication refill

## 2013-05-10 ENCOUNTER — Encounter: Payer: Self-pay | Admitting: Family Medicine

## 2013-05-10 ENCOUNTER — Ambulatory Visit: Payer: BC Managed Care – PPO | Admitting: Family Medicine

## 2013-05-10 VITALS — BP 124/76 | HR 82 | Temp 99.1°F | Resp 16 | Ht 68.0 in | Wt 146.2 lb

## 2013-05-10 DIAGNOSIS — G8929 Other chronic pain: Secondary | ICD-10-CM

## 2013-05-10 DIAGNOSIS — F411 Generalized anxiety disorder: Secondary | ICD-10-CM

## 2013-05-10 DIAGNOSIS — M25559 Pain in unspecified hip: Secondary | ICD-10-CM

## 2013-05-10 NOTE — Progress Notes (Signed)
S:  This 62 y.o. Cauc female has anxiety disorder with some component of depression; she is doing much better and sees her therapist every 3 months or so. She would like to wean off Effexor; current dose is 1 tablet twice a day. Abnormal sweating may be related to this medication. She is no longer taking Alprazolam. She is satisfied with work. Sleep hygiene is fair; she has some early morning awakening (2-3 am) but can usually return back to sleep. She has also increased her sun exposure and this has eliminated a recurrent problem w/ vaginal discharge.  Pt c/o 42-month hx of R hip pain, as if she pulled a hamstring muscle. It hurts daily but is not constant. She denies acute injury. She sits alot when working and tries to get up and walk and stretch so as not to get stiff. She has morning achiness. Pt denies paresthesias, numbness or weakness in lower extremities. 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 She has been treated at [facility] in past w/ great results.  Patient Active Problem List   Diagnosis Date Noted  . Anxiety disorder 11/05/2011  . Menopause   . Murmur   . Poikiloderma   . Herpes zoster   . Degenerative disk disease   . Tinnitus   . Colon polyp     PMHx, Soc Hx and Fam Hx reviewed. Medications reconciled.  ROS: AS per HPI; negative for fatigue, appetite change or weight loss, CP or tightness, palpitations, edema, SOB or DOE, GI problems, HA, dizziness, numbness, weakness, tremor or syncope. She denies agitation, confusion, lack of concentration or dysphoric mood.  O: Filed Vitals:   05/10/13 0911  BP: 124/76  Pulse: 82  Temp: 99.1 F (37.3 C)  Resp: 16   GEN: In NAD; WN,WD. HENT: [place]/AT; EOMI w/ clear conj/sclerae. EACs/ nose/ oroph unremarkable. SKIN: W&D; hyperpigmentation prominently on facial area. COR: RRR. LUNGS: Normal resp rate and effort. BACK: No CVAT; spine straight. No SI joint tenderness. MS: MAEs; no deformities or effusions. R hip- FROM, no  crepitus or point tenderness. Gait is normal. NEURO: A&Ox 3; CNs intact. Nonfocal.  A/P: Anxiety state, unspecified- Wean off Effexor- take 1 tab every other morning for 2 weeks then discontinue. Continue nightly dose through end of August then take pm dose every other night through September. Discontinue medication at end of September 2014.  Chronic hip pain, right- Maintain regular exercise  (walking 3x/week). Stretching daily. May use OTC NSAID for brief period to see if that helps. Flax Seed oil capsule 1000 mg 1 cap daily. If no improvement after several weeks, contact clinic for re-evaluation and possible xray.

## 2013-05-10 NOTE — Patient Instructions (Signed)
For Effexor taper- follow the schedule we discussed. You should be off this medication by July 05, 2013.  Continue Vitamin D supplementation and regular safe sun exposure. Get over-the-counter Flax Seed Oil capsules 1000 mg per capsule and take 1 daily. Practice daily stretching exercises and contact the office if you decide you would like to return to [facility]; you may be able to schedule with them yourself since you have been seen there in the past.  Next appointment will be in 6 months for complete physical exam.

## 2013-05-22 ENCOUNTER — Encounter: Payer: Self-pay | Admitting: Family Medicine

## 2013-05-29 NOTE — Telephone Encounter (Signed)
Please advise on this.  

## 2013-06-29 ENCOUNTER — Other Ambulatory Visit: Payer: Self-pay | Admitting: Family Medicine

## 2013-07-27 ENCOUNTER — Ambulatory Visit (INDEPENDENT_AMBULATORY_CARE_PROVIDER_SITE_OTHER): Payer: BC Managed Care – PPO | Admitting: Internal Medicine

## 2013-07-27 VITALS — BP 120/72 | HR 80 | Temp 98.0°F | Resp 20 | Ht 65.5 in | Wt 153.2 lb

## 2013-07-27 DIAGNOSIS — F411 Generalized anxiety disorder: Secondary | ICD-10-CM

## 2013-07-27 DIAGNOSIS — G4700 Insomnia, unspecified: Secondary | ICD-10-CM

## 2013-07-27 DIAGNOSIS — L439 Lichen planus, unspecified: Secondary | ICD-10-CM

## 2013-07-27 DIAGNOSIS — Z23 Encounter for immunization: Secondary | ICD-10-CM

## 2013-07-27 MED ORDER — ALPRAZOLAM 0.5 MG PO TABS: ORAL_TABLET | ORAL | Status: DC

## 2013-07-27 MED ORDER — ALPRAZOLAM 0.5 MG PO TBDP: 0.5000 mg | ORAL_TABLET | Freq: Two times a day (BID) | ORAL | Status: DC | PRN

## 2013-07-27 NOTE — Progress Notes (Signed)
  Subjective:    Patient ID: Rebecca [name], female    DOB: [birth year], 62 y.o.   MRN: [id number]  HPI Facial dermatitis improvinf Stress controlled, needs alprazolam rf and rf to [facility]. Sees counselor Dr. Jen Mow, and pschciatry is starting lamictal soon. Leighton Ruff [facility]/meds  Review of Systems LPP     Objective:   Physical Exam  Vitals reviewed. Constitutional: She is oriented to person, place, and time. She appears well-developed and well-nourished. No distress.  HENT:  Head: Normocephalic.  Eyes: EOM are normal. No scleral icterus.  Pulmonary/Chest: Effort normal.  Musculoskeletal: Normal range of motion.  Neurological: She is alert and oriented to person, place, and time. Coordination normal.  Skin: Rash noted.  Psychiatric: She has a normal mood and affect. Her behavior is normal. Judgment and thought content normal.    Flu vac      Assessment & Plan:  Lichen planus pilaris facial dermatitis Anxiety/Insomnia RF to [facility].

## 2013-07-27 NOTE — Patient Instructions (Signed)
Stress Stress-related medical problems are becoming increasingly common. The body has a built-in physical response to stressful situations. Faced with pressure, challenge or danger, we need to react quickly. Our bodies release hormones such as cortisol and adrenaline to help do this. These hormones are part of the "fight or flight" response and affect the metabolic rate, heart rate and blood pressure, resulting in a heightened, stressed state that prepares the body for optimum performance in dealing with a stressful situation. It is likely that early man required these mechanisms to stay alive, but usually modern stresses do not call for this, and the same hormones released in today's world can damage health and reduce coping ability. CAUSES  Pressure to perform at work, at school or in sports.  Threats of physical violence.  Money worries.  Arguments.  Family conflicts.  Divorce or separation from significant other.  Bereavement.  New job or unemployment.  Changes in location.  Alcohol or drug abuse. SOMETIMES, THERE IS NO PARTICULAR REASON FOR DEVELOPING STRESS. Almost all people are at risk of being stressed at some time in their lives. It is important to know that some stress is temporary and some is long term.  Temporary stress will go away when a situation is resolved. Most people can cope with short periods of stress, and it can often be relieved by relaxing, taking a walk, chatting through issues with friends, or having a good night's sleep.  Chronic (long-term, continuous) stress is much harder to deal with. It can be psychologically and emotionally damaging. It can be harmful both for an individual and for friends and family. SYMPTOMS Everyone reacts to stress differently. There are some common effects that help [place] recognize it. In times of extreme stress, people may:  Shake uncontrollably.  Breathe faster and deeper than normal (hyperventilate).  Vomit.  For people  with asthma, stress can trigger an attack.  For some people, stress may trigger migraine headaches, ulcers, and body pain. PHYSICAL EFFECTS OF STRESS MAY INCLUDE:  Loss of energy.  Skin problems.  Aches and pains resulting from tense muscles, including neck ache, backache and tension headaches.  Increased pain from arthritis and other conditions.  Irregular heart beat (palpitations).  Periods of irritability or anger.  Apathy or depression.  Anxiety (feeling uptight or worrying).  Unusual behavior.  Loss of appetite.  Comfort eating.  Lack of concentration.  Loss of, or decreased, sex-drive.  Increased smoking, drinking, or recreational drug use.  For women, missed periods.  Ulcers, joint pain, and muscle pain. Post-traumatic stress is the stress caused by any serious accident, strong emotional damage, or extremely difficult or violent experience such as rape or war. Post-traumatic stress victims can experience mixtures of emotions such as fear, shame, depression, guilt or anger. It may include recurrent memories or images that may be haunting. These feelings can last for weeks, months or even years after the traumatic event that triggered them. Specialized treatment, possibly with medicines and psychological therapies, is available. If stress is causing physical symptoms, severe distress or making it difficult for you to function as normal, it is worth seeing your caregiver. It is important to remember that although stress is a usual part of life, extreme or prolonged stress can lead to other illnesses that will need treatment. It is better to visit a doctor sooner rather than later. Stress has been linked to the development of high blood pressure and heart disease, as well as insomnia and depression. There is no diagnostic test for  stress since everyone reacts to it differently. But a caregiver will be able to spot the physical symptoms, such  as:  Headaches.  Shingles.  Ulcers. Emotional distress such as intense worry, low mood or irritability should be detected when the doctor asks pertinent questions to identify any underlying problems that might be the cause. In case there are physical reasons for the symptoms, the doctor may also want to do some tests to exclude certain conditions. If you feel that you are suffering from stress, try to identify the aspects of your life that are causing it. Sometimes you may not be able to change or avoid them, but even a small change can have a positive ripple effect. A simple lifestyle change can make all the difference. STRATEGIES THAT CAN HELP DEAL WITH STRESS:  Delegating or sharing responsibilities.  Avoiding confrontations.  Learning to be more assertive.  Regular exercise.  Avoid using alcohol or street drugs to cope.  Eating a healthy, balanced diet, rich in fruit and vegetables and proteins.  Finding humor or absurdity in stressful situations.  Never taking on more than you know you can handle comfortably.  Organizing your time better to get as much done as possible.  Talking to friends or family and sharing your thoughts and fears.  Listening to music or relaxation tapes.  Tensing and then relaxing your muscles, starting at the toes and working up to the head and neck. If you think that you would benefit from help, either in identifying the things that are causing your stress or in learning techniques to help you relax, see a caregiver who is capable of helping you with this. Rather than relying on medications, it is usually better to try and identify the things in your life that are causing stress and try to deal with them. There are many techniques of managing stress including counseling, psychotherapy, aromatherapy, yoga, and exercise. Your caregiver can help you determine what is best for you. Document Released: 12/12/2002 Document Revised: 12/14/2011 Document  Reviewed: 11/08/2007 [facility] Patient Information 2014 [place], [place]. Insomnia Insomnia is frequent trouble falling and/or staying asleep. Insomnia can be a long term problem or a short term problem. Both are common. Insomnia can be a short term problem when the wakefulness is related to a certain stress or worry. Long term insomnia is often related to ongoing stress during waking hours and/or poor sleeping habits. Overtime, sleep deprivation itself can make the problem worse. Every little thing feels more severe because you are overtired and your ability to cope is decreased. CAUSES   Stress, anxiety, and depression.  Poor sleeping habits.  Distractions such as TV in the bedroom.  Naps close to bedtime.  Engaging in emotionally charged conversations before bed.  Technical reading before sleep.  Alcohol and other sedatives. They may make the problem worse. They can hurt normal sleep patterns and normal dream activity.  Stimulants such as caffeine for several hours prior to bedtime.  Pain syndromes and shortness of breath can cause insomnia.  Exercise late at night.  Changing time zones may cause sleeping problems (jet lag). It is sometimes helpful to have someone observe your sleeping patterns. They should look for periods of not breathing during the night (sleep apnea). They should also look to see how long those periods last. If you live alone or observers are uncertain, you can also be observed at a sleep clinic where your sleep patterns will be professionally monitored. Sleep apnea requires a checkup and treatment. Give your  caregivers your medical history. Give your caregivers observations your family has made about your sleep.  SYMPTOMS   Not feeling rested in the morning.  Anxiety and restlessness at bedtime.  Difficulty falling and staying asleep. TREATMENT   Your caregiver may prescribe treatment for an underlying medical disorders. Your caregiver can give advice or  help if you are using alcohol or other drugs for self-medication. Treatment of underlying problems will usually eliminate insomnia problems.  Medications can be prescribed for short time use. They are generally not recommended for lengthy use.  Over-the-counter sleep medicines are not recommended for lengthy use. They can be habit forming.  You can promote easier sleeping by making lifestyle changes such as:  Using relaxation techniques that help with breathing and reduce muscle tension.  Exercising earlier in the day.  Changing your diet and the time of your last meal. No night time snacks.  Establish a regular time to go to bed.  Counseling can help with stressful problems and worry.  Soothing music and white noise may be helpful if there are background noises you cannot remove.  Stop tedious detailed work at least one hour before bedtime. HOME CARE INSTRUCTIONS   Keep a diary. Inform your caregiver about your progress. This includes any medication side effects. See your caregiver regularly. Take note of:  Times when you are asleep.  Times when you are awake during the night.  The quality of your sleep.  How you feel the next day. This information will help your caregiver care for you.  Get out of bed if you are still awake after 15 minutes. Read or do some quiet activity. Keep the lights down. Wait until you feel sleepy and go back to bed.  Keep regular sleeping and waking hours. Avoid naps.  Exercise regularly.  Avoid distractions at bedtime. Distractions include watching television or engaging in any intense or detailed activity like attempting to balance the household checkbook.  Develop a bedtime ritual. Keep a familiar routine of bathing, brushing your teeth, climbing into bed at the same time each night, listening to soothing music. Routines increase the success of falling to sleep faster.  Use relaxation techniques. This can be using breathing and muscle tension  release routines. It can also include visualizing peaceful scenes. You can also help control troubling or intruding thoughts by keeping your mind occupied with boring or repetitive thoughts like the old concept of counting sheep. You can make it more creative like imagining planting one beautiful flower after another in your backyard garden.  During your day, work to eliminate stress. When this is not possible use some of the previous suggestions to help reduce the anxiety that accompanies stressful situations. MAKE SURE YOU:   Understand these instructions.  Will watch your condition.  Will get help right away if you are not doing well or get worse. Document Released: 09/18/2000 Document Revised: 12/14/2011 Document Reviewed: 10/19/2007 [facility] Patient Information 2014 [place], [place].

## 2013-11-15 ENCOUNTER — Encounter: Payer: Self-pay | Admitting: Family Medicine

## 2013-11-15 ENCOUNTER — Ambulatory Visit (INDEPENDENT_AMBULATORY_CARE_PROVIDER_SITE_OTHER): Payer: BC Managed Care – PPO | Admitting: Family Medicine

## 2013-11-15 VITALS — BP 118/60 | HR 68 | Temp 97.6°F | Resp 16 | Ht 68.0 in | Wt 148.0 lb

## 2013-11-15 DIAGNOSIS — N951 Menopausal and female climacteric states: Secondary | ICD-10-CM

## 2013-11-15 DIAGNOSIS — M545 Low back pain, unspecified: Secondary | ICD-10-CM

## 2013-11-15 DIAGNOSIS — Z780 Asymptomatic menopausal state: Secondary | ICD-10-CM

## 2013-11-15 DIAGNOSIS — Z124 Encounter for screening for malignant neoplasm of cervix: Secondary | ICD-10-CM

## 2013-11-15 DIAGNOSIS — M79605 Pain in left leg: Secondary | ICD-10-CM

## 2013-11-15 DIAGNOSIS — Z01419 Encounter for gynecological examination (general) (routine) without abnormal findings: Secondary | ICD-10-CM

## 2013-11-15 DIAGNOSIS — Z0000 Encounter for general adult medical examination without abnormal findings: Secondary | ICD-10-CM

## 2013-11-15 LAB — LIPID PANEL
CHOL/HDL RATIO: 2 ratio
Cholesterol: 200 mg/dL (ref 0–200)
HDL: 101 mg/dL (ref >39)
LDL CALC: 91 mg/dL (ref 0–99)
Triglycerides: 42 mg/dL (ref <150)
VLDL: 8 mg/dL (ref 0–40)

## 2013-11-15 LAB — POCT WET PREP WITH KOH
KOH PREP POC: NEGATIVE
TRICHOMONAS UA: NEGATIVE
Yeast Wet Prep HPF POC: NEGATIVE

## 2013-11-15 LAB — COMPLETE METABOLIC PANEL WITH GFR
ALBUMIN: 4.3 g/dL (ref 3.5–5.2)
ALK PHOS: 45 U/L (ref 39–117)
ALT: 16 U/L (ref 0–35)
AST: 21 U/L (ref 0–37)
BUN: 15 mg/dL (ref 6–23)
CHLORIDE: 105 meq/L (ref 96–112)
CO2: 26 mEq/L (ref 19–32)
Calcium: 9.4 mg/dL (ref 8.4–10.5)
Creat: 0.99 mg/dL (ref 0.50–1.10)
GFR, Est African American: 70 mL/min
GFR, Est Non African American: 61 mL/min
Glucose, Bld: 79 mg/dL (ref 70–99)
POTASSIUM: 4 meq/L (ref 3.5–5.3)
SODIUM: 139 meq/L (ref 135–145)
TOTAL PROTEIN: 6.9 g/dL (ref 6.0–8.3)
Total Bilirubin: 0.6 mg/dL (ref 0.2–1.2)

## 2013-11-15 LAB — CBC
HEMATOCRIT: 33.5 % — AB (ref 36.0–46.0)
HEMOGLOBIN: 11.1 g/dL — AB (ref 12.0–15.0)
MCH: 29.4 pg (ref 26.0–34.0)
MCHC: 33.1 g/dL (ref 30.0–36.0)
MCV: 88.9 fL (ref 78.0–100.0)
Platelets: 222 10*3/uL (ref 150–400)
RBC: 3.77 MIL/uL — AB (ref 3.87–5.11)
RDW: 13.6 % (ref 11.5–15.5)
WBC: 2.7 10*3/uL — ABNORMAL LOW (ref 4.0–10.5)

## 2013-11-15 LAB — HEPATITIS C ANTIBODY: HCV AB: NEGATIVE

## 2013-11-15 LAB — T4, FREE: Free T4: 1.17 ng/dL (ref 0.80–1.80)

## 2013-11-15 LAB — TSH: TSH: 2.571 u[IU]/mL (ref 0.350–4.500)

## 2013-11-15 LAB — T3, FREE: T3, Free: 2.8 pg/mL (ref 2.3–4.2)

## 2013-11-15 MED ORDER — CONJ ESTROG-MEDROXYPROGEST ACE 0.3-1.5 MG PO TABS: ORAL_TABLET | ORAL | Status: DC

## 2013-11-15 MED ORDER — MELOXICAM 7.5 MG PO TABS: ORAL_TABLET | ORAL | Status: DC

## 2013-11-15 NOTE — Progress Notes (Signed)
Subjective:    Patient ID: Rebecca [name], female    DOB: 1951-02-24, 63 y.o.   MRN: [id number]  HPI  This 63 y.o. Cauc female is here for CPE/PAP. She continues to see DERM specialist at [facility] (last visit November 2014). She is enrolled in a study for the rare skin condition for which she takes Plaquenil. CMET and CBC performed in Nov 2014; remarkable for low WBC= 3.6.  Pt has chronic anxiety and depression; Alprazolam is adequate for mild symptoms and Lamotragine 150 mg daily has been prescribed by another provider. Menopause symptoms have lessened; Premarin cream alleviates vaginal atrophy. Pt would like to resume use of Prempro which she used years ago; this medication was taken every 3rd day.  HCM: MMG- Current (2014- negative).           PAP- 2012 (negative; treatment to cervix > 30 years ago).           IMM- Current.           CRS: Current (2013- normal).           DEXA- 2012? (normal).           Vision- Annually (a "must" due to Plaquenil treatment).  Patient Active Problem List   Diagnosis Date Noted  . Lichen planus 82/42/3536  . Anxiety disorder 11/05/2011  . Menopause   . Murmur   . Poikiloderma   . Herpes zoster   . Degenerative disk disease   . Tinnitus   . Colon polyp    PMHx, Surg Hx, Soc and Fam Hx reviewed.  MEDICATIONS reconciled.  Review of Systems  Constitutional: Positive for diaphoresis.  HENT: Negative.   Eyes: Negative.   Respiratory: Negative.   Cardiovascular: Negative.   Gastrointestinal: Negative.   Endocrine: Negative.   Genitourinary: Positive for menstrual problem.  Musculoskeletal: Positive for back pain.       LBP on L side; radiates down lateral aspect of leg, awakens pt at night and throbs. Relief w/ Meloxicam.  Allergic/Immunologic: Positive for environmental allergies.  Hematological: Negative.   Psychiatric/Behavioral: The patient is nervous/anxious.       Objective:   Physical Exam  Nursing note and  vitals reviewed. Constitutional: She is oriented to person, place, and time. Vital signs are normal. She appears well-developed and well-nourished. No distress.  HENT:  Head: Normocephalic and atraumatic.  Right Ear: Hearing, tympanic membrane, external ear and ear canal normal.  Left Ear: Hearing, tympanic membrane, external ear and ear canal normal.  Nose: Nose normal. No nasal deformity or septal deviation.  Mouth/Throat: Uvula is midline, oropharynx is clear and moist and mucous membranes are normal. No oral lesions. Normal dentition. No dental caries.  Eyes: Conjunctivae, EOM and lids are normal. Pupils are equal, round, and reactive to light. No scleral icterus.  Fundoscopic exam:      The right eye shows no arteriolar narrowing, no AV nicking and no papilledema. The right eye shows red reflex.       The left eye shows no arteriolar narrowing, no AV nicking and no papilledema. The left eye shows red reflex.  Neck: Trachea normal, normal range of motion and full passive range of motion without pain. Neck supple. No JVD present. No spinous process tenderness and no muscular tenderness present. Carotid bruit is not present. No mass and no thyromegaly present.  Cardiovascular: Normal rate, regular rhythm, S1 normal, S2 normal, normal heart sounds, intact distal pulses and normal pulses.   No  extrasystoles are present. PMI is not displaced.  Exam reveals no gallop and no friction rub.   No murmur heard. Pulmonary/Chest: Effort normal and breath sounds normal. No respiratory distress. She has no decreased breath sounds. She has no wheezes. She has no rhonchi. Right breast exhibits no inverted nipple, no mass, no nipple discharge, no skin change and no tenderness. Left breast exhibits no inverted nipple, no mass, no nipple discharge, no skin change and no tenderness. Breasts are symmetrical.  Abdominal: Soft. Normal appearance and bowel sounds are normal. She exhibits no distension, no abdominal  bruit, no pulsatile midline mass and no mass. There is no hepatosplenomegaly. There is no tenderness. There is no guarding and no CVA tenderness. No hernia.  Genitourinary: Rectum normal and vagina normal. There is no rash, tenderness or lesion on the right labia. There is no rash, tenderness or lesion on the left labia. Uterus is not deviated, not enlarged, not fixed and not tender. Cervix exhibits no motion tenderness, no discharge and no friability. Right adnexum displays no mass, no tenderness and no fullness. Left adnexum displays no mass, no tenderness and no fullness.  Vaginal vault has copious amounts of cream; cervix appears normal.  Musculoskeletal:       Right shoulder: Normal.       Left shoulder: She exhibits decreased range of motion. She exhibits no tenderness, no bony tenderness, no swelling, no crepitus and no deformity.       Right elbow: Normal.      Left elbow: Normal.       Right wrist: Normal.       Left wrist: Normal.       Right hip: Normal.       Left hip: Normal.       Right knee: Normal.       Left knee: Normal.       Cervical back: Normal.       Thoracic back: Normal.       Lumbar back: Normal.       Right upper leg: Normal.       Left upper leg: Normal.       Right lower leg: Normal.       Left lower leg: Normal.  Lymphadenopathy:       Head (right side): No submental, no submandibular, no tonsillar, no preauricular, no posterior auricular and no occipital adenopathy present.       Head (left side): No submental, no submandibular, no tonsillar, no preauricular, no posterior auricular and no occipital adenopathy present.    She has no cervical adenopathy.    She has no axillary adenopathy.       Right: No inguinal and no supraclavicular adenopathy present.       Left: No inguinal and no supraclavicular adenopathy present.  Neurological: She is alert and oriented to person, place, and time. She has normal strength. She displays no atrophy and no tremor. No  cranial nerve deficit or sensory deficit. She exhibits normal muscle tone. She displays a negative Romberg sign. Coordination and gait normal.  Reflex Scores:      Tricep reflexes are 1+ on the right side and 1+ on the left side.      Bicep reflexes are 2+ on the right side and 2+ on the left side.      Brachioradialis reflexes are 2+ on the right side and 2+ on the left side.      Patellar reflexes are 1+ on the right side and 1+  on the left side. Skin: Skin is warm, dry and intact. No ecchymosis and no petechiae noted. She is not diaphoretic. No cyanosis or erythema. No pallor. Nails show no clubbing.  Hyperpigmentation most visible on face. Dry skin overall. Few cherry hemagiomas and benign nevi on trunk.  Psychiatric: She has a normal mood and affect. Her speech is normal and behavior is normal. Judgment and thought content normal. Cognition and memory are normal.    Results for orders placed in visit on 11/15/13  POCT WET PREP WITH KOH      Result Value Ref Range   Trichomonas, UA Negative     Clue Cells Wet Prep HPF POC 12-13     Epithelial Wet Prep HPF POC 0-6     Yeast Wet Prep HPF POC neg     Bacteria Wet Prep HPF POC 3+     RBC Wet Prep HPF POC 0-3     WBC Wet Prep HPF POC 4-9     KOH Prep POC Negative        Assessment & Plan:  Routine general medical examination at a health care facility - Plan: T4, free, T3, Free, TSH, CBC, Lipid panel, COMPLETE METABOLIC PANEL WITH GFR, Hepatitis C antibody, POCT Wet Prep with KOH  Encounter for cervical Pap smear with pelvic exam - Plan: Pap IG (Image Guided)  LBP radiating to left leg- Continue flexibility and strength exercises. Meloxicam daily prn; return if symptoms persist or worsen.  Post menopausal syndrome- Resume Prempro on every 3rd day schedule.  Meds ordered this encounter  Medications  . meloxicam (MOBIC) 7.5 MG tablet    Sig: Take 1 tablet by mouth twice daily prn pain.    Dispense:  60 tablet    Refill:  3  .  estrogen, conjugated,-medroxyprogesterone (PREMPRO) 0.3-1.5 MG per tablet    Sig: Take 1 tablet daily or as directed.    Dispense:  30 tablet    Refill:  5

## 2013-11-15 NOTE — Patient Instructions (Signed)
Keeping You Healthy  Get These Tests  Blood Pressure- Have your blood pressure checked by your healthcare provider at least once a year.  Normal blood pressure is 120/80.  Weight- Have your body mass index (BMI) calculated to screen for obesity.  BMI is a measure of body fat based on height and weight.  You can calculate your own BMI at [employer]  Cholesterol- Have your cholesterol checked every year.  Diabetes- Have your blood sugar checked every year if you have high blood pressure, high cholesterol, a family history of diabetes or if you are overweight.  Pap Smear- Have a pap smear every 1 to 3 years if you have been sexually active.  If you are older than 65 and recent pap smears have been normal you may not need additional pap smears.  In addition, if you have had a hysterectomy  For benign disease additional pap smears are not necessary.  Mammogram-Yearly mammograms are essential for early detection of breast cancer  Screening for Colon Cancer- Colonoscopy starting at age 30. Screening may begin sooner depending on your family history and other health conditions.  Follow up colonoscopy as directed by your Gastroenterologist.  Screening for Osteoporosis- Screening begins at age 70 with bone density scanning, sooner if you are at higher risk for developing Osteoporosis.  Get these medicines  Calcium with Vitamin D- Your body requires 1200-1500 mg of Calcium a day and [phone] IU of Vitamin D a day.  You can only absorb 500 mg of Calcium at a time therefore Calcium must be taken in 2 or 3 separate doses throughout the day.  Hormones- Hormone therapy has been associated with increased risk for certain cancers and heart disease.  Talk to your healthcare provider about if you need relief from menopausal symptoms.  Aspirin- Ask your healthcare provider about taking Aspirin to prevent Heart Disease and Stroke.  Get these Immuniztions  Flu shot- Every fall  Pneumonia  shot- Once after the age of 62; if you are younger ask your healthcare provider if you need a pneumonia shot.  Tetanus- Every ten years. Tdap received in 2010. Next Tetanus due in 2020.  Zostavax- Once after the age of 37 to prevent shingles.  Take these steps  Don't smoke- Your healthcare provider can help you quit. For tips on how to quit, ask your healthcare provider or go to www.smokefree.gov or call 1-800 QUIT-NOW.  Be physically active- Exercise 5 days a week for a minimum of 30 minutes.  If you are not already physically active, start slow and gradually work up to 30 minutes of moderate physical activity.  Try walking, dancing, bike riding, swimming, etc.  Eat a healthy diet- Eat a variety of healthy foods such as fruits, vegetables, whole grains, low fat milk, low fat cheeses, yogurt, lean meats, chicken, fish, eggs, dried beans, tofu, etc.  For more information go to www.thenutritionsource.org  Dental visit- Brush and floss teeth twice daily; visit your dentist twice a year.  Eye exam- Visit your Optometrist or Ophthalmologist yearly.  Drink alcohol in moderation- Limit alcohol intake to one drink or less a day.  Never drink and drive.  Depression- Your emotional health is as important as your physical health.  If you're feeling down or losing interest in things you normally enjoy, please talk to your healthcare provider.  Seat Belts- can save your life; always wear one  Smoke/Carbon Monoxide detectors- These detectors need to be installed on the appropriate level of your home.  Replace batteries  at least once a year.  Violence- If anyone is threatening or hurting you, please tell your healthcare provider.  Living Will/ Health care power of attorney- Discuss with your healthcare provider and family.     Sciatica Sciatica is pain, weakness, numbness, or tingling along the path of the sciatic nerve. The nerve starts in the lower back and runs down the back of each leg. The  nerve controls the muscles in the lower leg and in the back of the knee, while also providing sensation to the back of the thigh, lower leg, and the sole of your foot. Sciatica is a symptom of another medical condition. For instance, nerve damage or certain conditions, such as a herniated disk or bone spur on the spine, pinch or put pressure on the sciatic nerve. This causes the pain, weakness, or other sensations normally associated with sciatica. Generally, sciatica only affects one side of the body. CAUSES   Herniated or slipped disc.  Degenerative disk disease.  A pain disorder involving the narrow muscle in the buttocks (piriformis syndrome).  Pelvic injury or fracture.  Pregnancy.  Tumor (rare). SYMPTOMS  Symptoms can vary from mild to very severe. The symptoms usually travel from the low back to the buttocks and down the back of the leg. Symptoms can include:  Mild tingling or dull aches in the lower back, leg, or hip.  Numbness in the back of the calf or sole of the foot.  Burning sensations in the lower back, leg, or hip.  Sharp pains in the lower back, leg, or hip.  Leg weakness.  Severe back pain inhibiting movement. These symptoms may get worse with coughing, sneezing, laughing, or prolonged sitting or standing. Also, being overweight may worsen symptoms. DIAGNOSIS  Your caregiver will perform a physical exam to look for common symptoms of sciatica. He or she may ask you to do certain movements or activities that would trigger sciatic nerve pain. Other tests may be performed to find the cause of the sciatica. These may include:  Blood tests.  X-rays.  Imaging tests, such as an MRI or CT scan. TREATMENT  Treatment is directed at the cause of the sciatic pain. Sometimes, treatment is not necessary and the pain and discomfort goes away on its own. If treatment is needed, your caregiver may suggest:  Over-the-counter medicines to relieve pain.  Prescription  medicines, such as anti-inflammatory medicine, muscle relaxants, or narcotics.  Applying heat or ice to the painful area.  Steroid injections to lessen pain, irritation, and inflammation around the nerve.  Reducing activity during periods of pain.  Exercising and stretching to strengthen your abdomen and improve flexibility of your spine. Your caregiver may suggest losing weight if the extra weight makes the back pain worse.  Physical therapy.  Surgery to eliminate what is pressing or pinching the nerve, such as a bone spur or part of a herniated disk. HOME CARE INSTRUCTIONS   Only take over-the-counter or prescription medicines for pain or discomfort as directed by your caregiver.  Apply ice to the affected area for 20 minutes, 3 4 times a day for the first 48 72 hours. Then try heat in the same way.  Exercise, stretch, or perform your usual activities if these do not aggravate your pain.  Attend physical therapy sessions as directed by your caregiver.  Keep all follow-up appointments as directed by your caregiver.  Do not wear high heels or shoes that do not provide proper support.  Check your mattress to see  if it is too soft. A firm mattress may lessen your pain and discomfort. SEEK IMMEDIATE MEDICAL CARE IF:   You lose control of your bowel or bladder (incontinence).  You have increasing weakness in the lower back, pelvis, buttocks, or legs.  You have redness or swelling of your back.  You have a burning sensation when you urinate.  You have pain that gets worse when you lie down or awakens you at night.  Your pain is worse than you have experienced in the past.  Your pain is lasting longer than 4 weeks.  You are suddenly losing weight without reason. MAKE SURE YOU:  Understand these instructions.  Will watch your condition.  Will get help right away if you are not doing well or get worse. Document Released: 09/15/2001 Document Revised: 03/22/2012 Document  Reviewed: 01/31/2012 [facility] Patient Information 2014 [place].

## 2013-11-16 LAB — PAP IG (IMAGE GUIDED)

## 2013-11-17 ENCOUNTER — Encounter: Payer: Self-pay | Admitting: Family Medicine

## 2014-02-15 ENCOUNTER — Other Ambulatory Visit: Payer: Self-pay | Admitting: Internal Medicine

## 2014-02-15 NOTE — Telephone Encounter (Signed)
Alprazolam 0.5 mg tablet #60 w/ no RFs phoned to pt's pharmacy.

## 2015-02-13 ENCOUNTER — Ambulatory Visit (INDEPENDENT_AMBULATORY_CARE_PROVIDER_SITE_OTHER): Payer: BC Managed Care – PPO | Admitting: Physician Assistant

## 2015-02-13 VITALS — BP 136/72 | HR 72 | Temp 97.9°F | Resp 16 | Ht 68.0 in | Wt 149.6 lb

## 2015-02-13 DIAGNOSIS — J302 Other seasonal allergic rhinitis: Secondary | ICD-10-CM | POA: Diagnosis not present

## 2015-02-13 MED ORDER — OLOPATADINE HCL 0.2 % OP SOLN: 1.0000 [drp] | Freq: Every day | OPHTHALMIC | Status: DC

## 2015-02-13 MED ORDER — CETIRIZINE HCL 10 MG PO TABS: 10.0000 mg | ORAL_TABLET | Freq: Every day | ORAL | Status: DC

## 2015-02-13 MED ORDER — FLUTICASONE PROPIONATE 50 MCG/ACT NA SUSP: 2.0000 | Freq: Every day | NASAL | Status: DC

## 2015-02-13 NOTE — Patient Instructions (Signed)
For the next seven days take one Zyrtec daily while waiting for the Flonase to take effect.  Add sudafed 12 hour to this regimen as needed in the morning.  You will need to purchase this from the pharmacist. In the future start your flonase at the first sneezing episode during spring and fall, and use the above plan to control symptoms until it takes effect. You would be a good candidate for a steroid injection for allergies because your are healthy, however, we need to reserve this option in times of extreme symptoms not controlled by low risk medications.

## 2015-02-13 NOTE — Progress Notes (Signed)
02/13/2015 at 10:36 AM  Rebecca [name] / DOB: 13-Mar-1951 / MRN: [id number]  The patient has Menopause; Murmur; Poikiloderma; Herpes zoster; Degenerative disk disease; Tinnitus; Colon polyp; Anxiety disorder; and Lichen planus on her problem list.  SUBJECTIVE  Chief complaint: Cough; Itchy/watery eyes; and nasal congestion  Rebecca [name] is a 64 y.o. female complaining of sinus and nasal congestion that started 4 weeks ago.  Associated symptoms include runny nose and eye itching today, and she denies fever, cough, sore throat, difficulty breathing, headache and jaw pain.The patient symptoms show no change. Treatments tried thus far include Sudafed with good  relief. She denies sick contacts. She has a history of allergies and feels they are getting worse over time.  She has not tried Zyrtec or Flonase. She has a history of osteopenia.   She  has a past medical history of Menopause; Murmur; Osteopenia; Poikiloderma; Herpes zoster; Degenerative disk disease; Tinnitus; Colon polyp; and Anxiety.    Medications reviewed and updated by myself where necessary, and exist elsewhere in the encounter.   Rebecca [name] is allergic to codeine. She  reports that she quit smoking about 36 years ago. Her smoking use included Cigarettes. She smoked 1.00 pack per day. She does not have any smokeless tobacco history on file. She reports that she drinks alcohol. She reports that she does not use illicit drugs. She  reports that she currently engages in sexual activity. The patient  has past surgical history that includes Hemorrhoid surgery.  Her family history is not on file.  Review of Systems  Constitutional: Negative for fever, chills and diaphoresis.  Respiratory: Negative for sputum production, shortness of breath and wheezing.   Cardiovascular: Negative for chest pain.  Gastrointestinal: Negative for heartburn.  Genitourinary: Negative for dysuria.  Musculoskeletal: Negative for myalgias.  Skin: Negative for itching  and rash.  Neurological: Negative for dizziness, weakness and headaches.  Endo/Heme/Allergies: Positive for environmental allergies.    OBJECTIVE  Her  height is 5\' 8"  (1.727 m) and weight is 149 lb 9.6 oz (67.858 kg). Her oral temperature is 97.9 F (36.6 C). Her blood pressure is 136/72 and her pulse is 72. Her respiration is 16 and oxygen saturation is 98%.  The patient's body mass index is 22.75 kg/(m^2).  Physical Exam  Constitutional: She is oriented to person, place, and time. She appears well-developed and well-nourished. No distress.  HENT:  Right Ear: Hearing, tympanic membrane, external ear and ear canal normal.  Left Ear: Hearing, tympanic membrane, external ear and ear canal normal.  Nose: Mucosal edema present. Right sinus exhibits no maxillary sinus tenderness and no frontal sinus tenderness. Left sinus exhibits no maxillary sinus tenderness and no frontal sinus tenderness.  Mouth/Throat: Uvula is midline, oropharynx is clear and moist and mucous membranes are normal.  Eyes: Conjunctivae and EOM are normal. Pupils are equal, round, and reactive to light. Right eye exhibits no discharge and no exudate. Left eye exhibits no discharge and no exudate.  Cardiovascular: Normal rate, regular rhythm and normal heart sounds.   Respiratory: Effort normal and breath sounds normal. She has no wheezes. She has no rales.  Neurological: She is alert and oriented to person, place, and time.  Skin: Skin is warm and dry. She is not diaphoretic.  Psychiatric: She has a normal mood and affect.    No results found for this or any previous visit (from the past 24 hour(s)).  ASSESSMENT & PLAN  Aylla was seen today for cough, itchy/watery eyes and nasal  congestion.  Diagnoses and all orders for this visit:  Seasonal allergies Orders: -     fluticasone (FLONASE) 50 MCG/ACT nasal spray; Place 2 sprays into both nostrils daily. Take two sprays in each nostril daily. -     cetirizine (ZYRTEC)  10 MG tablet; Take 1 tablet (10 mg total) by mouth daily. -     Olopatadine HCl (PATADAY) 0.2 % SOLN; Apply 1 drop to eye daily.    The patient was advised to call or come back to clinic if she does not see an improvement in symptoms, or worsens with the above plan.   Philis Fendt, MHS, PA-C [facility] 02/13/2015 10:36 AM

## 2015-02-18 ENCOUNTER — Encounter: Payer: Self-pay | Admitting: *Deleted

## 2015-02-26 ENCOUNTER — Encounter: Payer: Self-pay | Admitting: Family Medicine

## 2015-02-26 ENCOUNTER — Ambulatory Visit (INDEPENDENT_AMBULATORY_CARE_PROVIDER_SITE_OTHER): Payer: BC Managed Care – PPO | Admitting: Family Medicine

## 2015-02-26 VITALS — BP 114/66 | HR 81 | Temp 98.9°F | Resp 18 | Ht 67.0 in | Wt 149.0 lb

## 2015-02-26 DIAGNOSIS — M6248 Contracture of muscle, other site: Secondary | ICD-10-CM

## 2015-02-26 DIAGNOSIS — M5481 Occipital neuralgia: Secondary | ICD-10-CM

## 2015-02-26 DIAGNOSIS — M62838 Other muscle spasm: Secondary | ICD-10-CM

## 2015-02-26 DIAGNOSIS — S29011A Strain of muscle and tendon of front wall of thorax, initial encounter: Secondary | ICD-10-CM

## 2015-02-26 MED ORDER — MELOXICAM 7.5 MG PO TABS: ORAL_TABLET | ORAL | Status: DC

## 2015-02-26 MED ORDER — CYCLOBENZAPRINE HCL 10 MG PO TABS: 10.0000 mg | ORAL_TABLET | Freq: Three times a day (TID) | ORAL | Status: DC | PRN

## 2015-02-26 NOTE — Patient Instructions (Signed)
Apply heat to shoulder and neck and chest wall for 15 minutes three times a day. Take the meloxicam with breakfast and dinner and the cyclobenzaprine before bed. Gentle stretching after heat.  If worsening or chest wall pain develops or you develop any other symptoms, please come back immediately for further evalution.  Chest Wall Pain Chest wall pain is pain in or around the bones and muscles of your chest. It may take up to 6 weeks to get better. It may take longer if you must stay physically active in your work and activities.  CAUSES  Chest wall pain may happen on its own. However, it may be caused by:  A viral illness like the flu.  Injury.  Coughing.  Exercise.  Arthritis.  Fibromyalgia.  Shingles. HOME CARE INSTRUCTIONS   Avoid overtiring physical activity. Try not to strain or perform activities that cause pain. This includes any activities using your chest or your abdominal and side muscles, especially if heavy weights are used.  Put ice on the sore area.  Put ice in a plastic bag.  Place a towel between your skin and the bag.  Leave the ice on for 15-20 minutes per hour while awake for the first 2 days.  Only take over-the-counter or prescription medicines for pain, discomfort, or fever as directed by your caregiver. SEEK IMMEDIATE MEDICAL CARE IF:   Your pain increases, or you are very uncomfortable.  You have a fever.  Your chest pain becomes worse.  You have new, unexplained symptoms.  You have nausea or vomiting.  You feel sweaty or lightheaded.  You have a cough with phlegm (sputum), or you cough up blood. MAKE SURE YOU:   Understand these instructions.  Will watch your condition.  Will get help right away if you are not doing well or get worse. Document Released: 09/21/2005 Document Revised: 12/14/2011 Document Reviewed: 05/18/2011 [facility] Patient Information 2015 [place], [place]. This information is not intended to replace advice given to you  by your health care provider. Make sure you discuss any questions you have with your health care provider. Occipital Neuralgia Occipital neuralgia is a type of headache that causes episodes of very bad pain in the back of your head. Pain from occipital neuralgia may spread (radiate) to other parts of your head. The pain is usually brief and often goes away after you rest and relax. These headaches may be caused by irritation of the nerves that leave your spinal cord high up in your neck, just below the base of your skull (occipital nerves). Your occipital nerves transmit sensations from the back of your head, the top of your head, and the areas behind your ears. CAUSES Occipital neuralgia can occur without any known cause (primary headache syndrome). In other cases, occipital neuralgia is caused by pressure on or irritation of one of the two occipital nerves. Causes of occipital nerve compression or irritation include:  Wear and tear of the vertebrae in the neck (osteoarthritis).  Neck injury.  Disease of the disks that separate the vertebrae.  Tumors.  Gout.  Infections.  Diabetes.  Swollen blood vessels that put pressure on the occipital nerves.  Muscle spasm in the neck. SIGNS AND SYMPTOMS Pain is the main symptom of occipital neuralgia. It usually starts in the back of the head but may also be felt in other areas supplied by the occipital nerves. Pain is usually on one side but may be on both sides. You may have:   Brief episodes of very  bad pain that is burning, stabbing, shocking, or shooting.  Pain behind the eye.  Pain triggered by neck movement or hair brushing.  Scalp tenderness.  Aching in the back of the head between episodes of very bad pain. DIAGNOSIS  Your health care provider may diagnose occipital neuralgia based on your symptoms and a physical exam. During the exam, the health care provider may push on areas supplied by the occipital nerves to see if they are  painful. Some tests may also be done to help in making the diagnosis. These may include:  Imaging studies of the upper spinal cord, such as an MRI or CT scan. These may show compression or spinal cord abnormalities.  Nerve block. You will get an injection of numbing medicine (local anesthetic) near the occipital nerve to see if this relieves pain. TREATMENT  Treatment may begin with simple measures, such as:   Rest.  Massage.  Heat.  Over-the-counter pain relievers. If these measures do not work, you may need other treatments, including:  Medicines such as:  Prescription-strength anti-inflammatory medicines.  Muscle relaxants.  Antiseizure medicines.  Antidepressants.  Steroid injection. This involves injections of local anesthetic and strong anti-inflammatory drugs (steroids).  Pulsed radiofrequency. Wires are implanted to deliver electrical impulses that block pain signals from the occipital nerve.  Physical therapy.  Surgery to relieve nerve pressure. HOME CARE INSTRUCTIONS  Take all medicines as directed by your health care provider.  Avoid activities that cause pain.  Rest when you have an attack of pain.  Try gentle massage or a heating pad to relieve pain.  Work with a physical therapist to learn stretching exercises you can do at home.  Try a different pillow or sleeping position.  Practice good posture.  Try to stay active. Get regular exercise that does not cause pain. Ask your health care provider to suggest safe exercises for you.  Keep all follow-up visits as directed by your health care provider. This is important. SEEK MEDICAL CARE IF:  Your medicine is not working.  You have new or worsening symptoms. SEEK IMMEDIATE MEDICAL CARE IF:  You have very bad head pain that is not going away.  You have a sudden change in vision, balance, or speech. MAKE SURE YOU:  Understand these instructions.  Will watch your condition.  Will get help  right away if you are not doing well or get worse. Document Released: 09/15/2001 Document Revised: 02/05/2014 Document Reviewed: 09/13/2013 [facility] Patient Information 2015 [place], [place]. This information is not intended to replace advice given to you by your health care provider. Make sure you discuss any questions you have with your health care provider.

## 2015-02-26 NOTE — Progress Notes (Signed)
Subjective:    Patient ID: Rebecca [name], female    DOB: [birth year], 64 y.o.   MRN: [id number] Chief Complaint  Patient presents with  . Shoulder Pain    rt x10 days   . Headache    x1 week     HPI  Developed right upper chest pain initially with painful breathing initially about 10d ago - thinks she might have pulled something as she was moving at the time. Then notices that she had more shoulder pain with moving righty shoulder and it seemed to radiate tot he back of her shouler. THen after several days she developed throbbing shooting pain in right neck radiating up her occipital scalp. Is intermittent but has continued to recur for the past week.  When it happens it pulese for a minutes, then will be releived sometimes by turning a certain way but then willo seem to start up randomlly. Ibuprofen has been taking the edge off.  No pain radiating down arm or weakness. No other HAs. No change in hearing/vision, no tremor,  Normal gait, no lightheadedness.  No h/o any prior right shoulder injuries though did have a rotator cuff inj on Lt in distant past. here 2 wks for allergies which has resolved and did not have any of the pain at that time.  Past Medical History  Diagnosis Date  . Menopause   . Murmur   . Osteopenia   . Poikiloderma   . Herpes zoster     2011  . Degenerative disk disease     C5-C6  . Tinnitus     L ear  . Colon polyp     2007  . Anxiety    Current Outpatient Prescriptions on File Prior to Visit  Medication Sig Dispense Refill  . cetirizine (ZYRTEC) 10 MG tablet Take 1 tablet (10 mg total) by mouth daily. 30 tablet 11  . fluticasone (FLONASE) 50 MCG/ACT nasal spray Place 2 sprays into both nostrils daily. Take two sprays in each nostril daily. 16 g 12  . hydroxychloroquine (PLAQUENIL) 200 MG tablet Take by mouth 2 (two) times daily.    Marland Kitchen lamoTRIgine (LAMICTAL) 150 MG tablet Take 150 mg by mouth daily.    Marland Kitchen OVER THE COUNTER MEDICATION Calcium w/D taking 2 a  day    . OVER THE COUNTER MEDICATION Vitamin D3 2000 mg taking daily    . PRESCRIPTION MEDICATION Triluma cream daily use    . PRESCRIPTION MEDICATION Fluoconicide using three times a week     No current facility-administered medications on file prior to visit.   Allergies  Allergen Reactions  . Codeine Hives     Review of Systems  Constitutional: Positive for fatigue. Negative for fever, chills, diaphoresis, activity change and appetite change.  HENT: Negative for congestion, rhinorrhea, sinus pressure, sore throat and trouble swallowing.   Eyes: Negative for visual disturbance.  Respiratory: Negative for cough, chest tightness, shortness of breath and wheezing.   Cardiovascular: Positive for chest pain. Negative for palpitations and leg swelling.  Genitourinary: Negative for decreased urine volume.  Musculoskeletal: Positive for myalgias, arthralgias, neck pain and neck stiffness. Negative for back pain, joint swelling and gait problem.  Skin: Negative for rash.  Neurological: Positive for headaches. Negative for dizziness, tremors, seizures, syncope, facial asymmetry, speech difficulty, weakness, light-headedness and numbness.  Hematological: Does not bruise/bleed easily.  Psychiatric/Behavioral: Negative for self-injury.       Objective:  BP 114/66 mmHg  Pulse 81  Temp(Src) 98.9 F (37.2 C) (  Oral)  Resp 18  Ht 5\' 7"  (1.702 m)  Wt 149 lb (67.586 kg)  BMI 23.33 kg/m2  SpO2 99%  Physical Exam  Constitutional: She is oriented to person, place, and time. She appears well-developed and well-nourished. No distress.  HENT:  Head: Normocephalic and atraumatic.  Right Ear: External ear normal.  Left Ear: External ear normal.  Eyes: Conjunctivae are normal. No scleral icterus.  Neck: Normal range of motion. Neck supple. No thyromegaly present.  Cardiovascular: Normal rate, regular rhythm, normal heart sounds and intact distal pulses.   Pulmonary/Chest: Effort normal and breath  sounds normal. No respiratory distress. She exhibits tenderness and bony tenderness. She exhibits no mass, no edema, no swelling and no retraction.    Musculoskeletal: She exhibits no edema.       Right shoulder: She exhibits deformity. She exhibits normal range of motion, no tenderness, no bony tenderness, no swelling, no effusion, no crepitus, no pain, no spasm, normal pulse and normal strength.       Left shoulder: Normal. She exhibits normal range of motion, no tenderness, no bony tenderness, no swelling, no effusion, no crepitus, no deformity, no laceration, no pain, no spasm, normal pulse and normal strength.       Cervical back: She exhibits tenderness and spasm. She exhibits normal range of motion, no bony tenderness, no swelling, no edema, no deformity and no laceration.  Right cervical paraspinal muscle with spasm No ttp over c-spine or paraspinal Ttp over right occipital grove.  Full ROM over bilateral shoulders but right should held approx 2 cm lower than left  Anterior right chest wall ttp  Lymphadenopathy:    She has no cervical adenopathy.  Neurological: She is alert and oriented to person, place, and time.  Skin: Skin is warm and dry. She is not diaphoretic. No erythema.  Psychiatric: She has a normal mood and affect. Her behavior is normal.      Assessment & Plan:   1. Occipital neuralgia of right side - likely due to neck spasm - likely exacerbates by chronic shoulder height mismatch - refer to PT - integrative therapies  2. Trapezius muscle spasm - heat, gentle stretching, nsaids, qhs cyclobenzaprine.  3. Chest wall muscle strain, initial encounter - atypical, start anti-inflammatories, make sure mammogram is UTD, low thresh-hold for rib films if pain continues, vigilant for any pulmonary/cardiac sxs    Orders Placed This Encounter  Procedures  . Ambulatory referral to Physical Therapy    Referral Priority:  Routine    Referral Type:  Physical Medicine    Referral  Reason:  Specialty Services Required    Requested Specialty:  Physical Therapy    Number of Visits Requested:  1    Meds ordered this encounter  Medications  . meloxicam (MOBIC) 7.5 MG tablet    Sig: Take 1 tablet by mouth twice daily prn pain.    Dispense:  60 tablet    Refill:  3  . cyclobenzaprine (FLEXERIL) 10 MG tablet    Sig: Take 1 tablet (10 mg total) by mouth 3 (three) times daily as needed for muscle spasms.    Dispense:  30 tablet    Refill:  0    I personally performed the services described in this documentation, which was scribed in my presence. The recorded information has been reviewed and considered, and addended by me as needed.  Delman Cheadle, MD MPH

## 2015-03-14 ENCOUNTER — Encounter: Payer: Self-pay | Admitting: Family Medicine

## 2015-05-09 ENCOUNTER — Ambulatory Visit (INDEPENDENT_AMBULATORY_CARE_PROVIDER_SITE_OTHER): Payer: BC Managed Care – PPO

## 2015-05-09 ENCOUNTER — Ambulatory Visit (INDEPENDENT_AMBULATORY_CARE_PROVIDER_SITE_OTHER): Payer: BC Managed Care – PPO | Admitting: Family Medicine

## 2015-05-09 VITALS — BP 108/70 | HR 87 | Temp 97.6°F | Resp 16 | Ht 68.0 in | Wt 144.5 lb

## 2015-05-09 DIAGNOSIS — R110 Nausea: Secondary | ICD-10-CM

## 2015-05-09 DIAGNOSIS — R1011 Right upper quadrant pain: Secondary | ICD-10-CM | POA: Diagnosis not present

## 2015-05-09 DIAGNOSIS — N390 Urinary tract infection, site not specified: Secondary | ICD-10-CM | POA: Diagnosis not present

## 2015-05-09 LAB — POCT CBC
Granulocyte percent: 50 %G (ref 37–80)
HCT, POC: 40.4 % (ref 37.7–47.9)
Hemoglobin: 13.4 g/dL (ref 12.2–16.2)
Lymph, poc: 2 (ref 0.6–3.4)
MCH, POC: 30.9 pg (ref 27–31.2)
MCHC: 33.1 g/dL (ref 31.8–35.4)
MCV: 93.4 fL (ref 80–97)
MID (cbc): 0.3 (ref 0–0.9)
MPV: 7.1 fL (ref 0–99.8)
POC Granulocyte: 2.2 (ref 2–6.9)
POC LYMPH PERCENT: 44.3 %L (ref 10–50)
POC MID %: 5.7 %M (ref 0–12)
Platelet Count, POC: 221 10*3/uL (ref 142–424)
RBC: 4.32 M/uL (ref 4.04–5.48)
RDW, POC: 13.4 %
WBC: 4.5 10*3/uL — AB (ref 4.6–10.2)

## 2015-05-09 LAB — POCT UA - MICROSCOPIC ONLY
Casts, Ur, LPF, POC: NEGATIVE
Crystals, Ur, HPF, POC: NEGATIVE
Mucus, UA: NEGATIVE
Yeast, UA: NEGATIVE

## 2015-05-09 LAB — POCT URINALYSIS DIPSTICK
Bilirubin, UA: NEGATIVE
Glucose, UA: NEGATIVE
Ketones, UA: NEGATIVE
Nitrite, UA: NEGATIVE
Protein, UA: NEGATIVE
Spec Grav, UA: 1.02
Urobilinogen, UA: 0.2
pH, UA: 5.5

## 2015-05-09 MED ORDER — CEPHALEXIN 500 MG PO CAPS: 500.0000 mg | ORAL_CAPSULE | Freq: Two times a day (BID) | ORAL | Status: DC

## 2015-05-09 NOTE — Patient Instructions (Signed)
Abdominal Pain °Many things can cause abdominal pain. Usually, abdominal pain is not caused by a disease and will improve without treatment. It can often be observed and treated at home. Your health care provider will do a physical exam and possibly order blood tests and X-rays to help determine the seriousness of your pain. However, in many cases, more time must pass before a clear cause of the pain can be found. Before that point, your health care provider may not know if you need more testing or further treatment. °HOME CARE INSTRUCTIONS  °Monitor your abdominal pain for any changes. The following actions may help to alleviate any discomfort you are experiencing: °· Only take over-the-counter or prescription medicines as directed by your health care provider. °· Do not take laxatives unless directed to do so by your health care provider. °· Try a clear liquid diet (broth, tea, or water) as directed by your health care provider. Slowly move to a bland diet as tolerated. °SEEK MEDICAL CARE IF: °· You have unexplained abdominal pain. °· You have abdominal pain associated with nausea or diarrhea. °· You have pain when you urinate or have a bowel movement. °· You experience abdominal pain that wakes you in the night. °· You have abdominal pain that is worsened or improved by eating food. °· You have abdominal pain that is worsened with eating fatty foods. °· You have a fever. °SEEK IMMEDIATE MEDICAL CARE IF:  °· Your pain does not go away within 2 hours. °· You keep throwing up (vomiting). °· Your pain is felt only in portions of the abdomen, such as the right side or the left lower portion of the abdomen. °· You pass bloody or black tarry stools. °MAKE SURE YOU: °· Understand these instructions.   °· Will watch your condition.   °· Will get help right away if you are not doing well or get worse.   °Document Released: 07/01/2005 Document Revised: 09/26/2013 Document Reviewed: 05/31/2013 °[employer]® Patient Information  ©2015 [employer]. This information is not intended to replace advice given to you by your health care provider. Make sure you discuss any questions you have with your health care provider. °Urinary Tract Infection °Urinary tract infections (UTIs) can develop anywhere along your urinary tract. Your urinary tract is your body's drainage system for removing wastes and extra water. Your urinary tract includes two kidneys, two ureters, a bladder, and a urethra. Your kidneys are a pair of bean-shaped organs. Each kidney is about the size of your fist. They are located below your ribs, one on each side of your spine. °CAUSES °Infections are caused by microbes, which are microscopic organisms, including fungi, viruses, and bacteria. These organisms are so small that they can only be seen through a microscope. Bacteria are the microbes that most commonly cause UTIs. °SYMPTOMS  °Symptoms of UTIs may vary by age and gender of the patient and by the location of the infection. Symptoms in young women typically include a frequent and intense urge to urinate and a painful, burning feeling in the bladder or urethra during urination. Older women and men are more likely to be tired, shaky, and weak and have muscle aches and abdominal pain. A fever may mean the infection is in your kidneys. Other symptoms of a kidney infection include pain in your back or sides below the ribs, nausea, and vomiting. °DIAGNOSIS °To diagnose a UTI, your caregiver will ask you about your symptoms. Your caregiver also will ask to provide a urine sample. The urine sample   will be tested for bacteria and white blood cells. White blood cells are made by your body to help fight infection. °TREATMENT  °Typically, UTIs can be treated with medication. Because most UTIs are caused by a bacterial infection, they usually can be treated with the use of antibiotics. The choice of antibiotic and length of treatment depend on your symptoms and the type of bacteria  causing your infection. °HOME CARE INSTRUCTIONS °· If you were prescribed antibiotics, take them exactly as your caregiver instructs you. Finish the medication even if you feel better after you have only taken some of the medication. °· Drink enough water and fluids to keep your urine clear or pale yellow. °· Avoid caffeine, tea, and carbonated beverages. They tend to irritate your bladder. °· Empty your bladder often. Avoid holding urine for long periods of time. °· Empty your bladder before and after sexual intercourse. °· After a bowel movement, women should cleanse from front to back. Use each tissue only once. °SEEK MEDICAL CARE IF:  °· You have back pain. °· You develop a fever. °· Your symptoms do not begin to resolve within 3 days. °SEEK IMMEDIATE MEDICAL CARE IF:  °· You have severe back pain or lower abdominal pain. °· You develop chills. °· You have nausea or vomiting. °· You have continued burning or discomfort with urination. °MAKE SURE YOU:  °· Understand these instructions. °· Will watch your condition. °· Will get help right away if you are not doing well or get worse. °Document Released: 07/01/2005 Document Revised: 03/22/2012 Document Reviewed: 10/30/2011 °[employer]® Patient Information ©2015 [employer]. This information is not intended to replace advice given to you by your health care provider. Make sure you discuss any questions you have with your health care provider. ° °

## 2015-05-09 NOTE — Progress Notes (Signed)
Chief Complaint:  Chief Complaint  Patient presents with  . Abdominal Pain    started this morning with diarrhea & spasms. Still painful    HPI: Rebecca [name] is a 64 y.o. female who reports to [facility] today complaining of 1 day history of nonbloody loose diarrhea x 2 episodes, RUQ abd pain, quesy feeling, she is passing gas , she feels minimally bloated , she has had chills , but she wants to make sure she does not have appendicitis or gaallstone issues. Denies vomiting. No vaginal or uri nary symptoms or discharge. No prior hx of SBO or abd surgery.  No fevers. Not associated with food, able to eat. UTD on colonoscopy. She denies constipation or straining. Last BM was today. No recent abx use  Past Medical History  Diagnosis Date  . Menopause   . Murmur   . Osteopenia   . Poikiloderma   . Herpes zoster     2011  . Degenerative disk disease     C5-C6  . Tinnitus     L ear  . Colon polyp     2007  . Anxiety    Past Surgical History  Procedure Laterality Date  . Hemorrhoid surgery     History   Social History  . Marital Status: Married    Spouse Name: N/A  . Number of Children: N/A  . Years of Education: N/A   Occupational History  . Futures trader    Social History Main Topics  . Smoking status: Former Smoker -- 1.00 packs/day    Types: Cigarettes    Quit date: 11/04/1978  . Smokeless tobacco: Not on file  . Alcohol Use: 0.0 oz/week    0 Standard drinks or equivalent per week     Comment:  usually 1 drink nightly  . Drug Use: No  . Sexual Activity: Yes   Other Topics Concern  . None   Social History Narrative   No family history on file. Allergies  Allergen Reactions  . Codeine Hives   Prior to Admission medications   Medication Sig Start Date End Date Taking? Authorizing Provider  cetirizine (ZYRTEC) 10 MG tablet Take 1 tablet (10 mg total) by mouth daily. 02/13/15  Yes Tereasa Coop, PA-C  fluticasone (FLONASE) 50 MCG/ACT nasal spray Place 2  sprays into both nostrils daily. Take two sprays in each nostril daily. 02/13/15  Yes Tereasa Coop, PA-C  hydroxychloroquine (PLAQUENIL) 200 MG tablet Take by mouth 2 (two) times daily.   Yes Historical Provider, MD  lamoTRIgine (LAMICTAL) 150 MG tablet Take 150 mg by mouth daily.   Yes Historical Provider, MD  PRESCRIPTION MEDICATION Fluoconicide using three times a week   Yes Historical Provider, MD  cyclobenzaprine (FLEXERIL) 10 MG tablet Take 1 tablet (10 mg total) by mouth 3 (three) times daily as needed for muscle spasms. Patient not taking: Reported on 05/09/2015 02/26/15   Shawnee Knapp, MD  meloxicam (MOBIC) 7.5 MG tablet Take 1 tablet by mouth twice daily prn pain. Patient not taking: Reported on 05/09/2015 02/26/15   Shawnee Knapp, MD  OVER THE COUNTER MEDICATION Calcium w/D taking 2 a day    Historical Provider, MD  OVER THE COUNTER MEDICATION Vitamin D3 2000 mg taking daily    Historical Provider, MD  PRESCRIPTION MEDICATION Triluma cream daily use    Historical Provider, MD     ROS: The patient denies fevers, chills, night sweats, unintentional weight loss, chest pain, palpitations, wheezing,  dyspnea on exertion,  vomiting, dysuria, hematuria, melena, numbness, weakness, or tingling.  All other systems have been reviewed and were otherwise negative with the exception of those mentioned in the HPI and as above.    PHYSICAL EXAM: Filed Vitals:   05/09/15 1939  BP: 108/70  Pulse: 87  Temp: 97.6 F (36.4 C)  Resp: 16   Body mass index is 21.98 kg/(m^2).   General: Alert, no acute distress HEENT:  Normocephalic, atraumatic, oropharynx patent. EOMI, PERRLA Cardiovascular:  Regular rate and rhythm, no rubs murmurs or gallops.  No Carotid bruits, radial pulse intact. No pedal edema.  Respiratory: Clear to auscultation bilaterally.  No wheezes, rales, or rhonchi.  No cyanosis, no use of accessory musculature Abdominal: No organomegaly, abdomen is soft and Right upper qaudrant tenderness,  positive bowel sounds. No masses. Skin: No rashes. Neurologic: Facial musculature symmetric. Psychiatric: Patient acts appropriately throughout our interaction. Lymphatic: No cervical or submandibular lymphadenopathy Musculoskeletal: Gait intact. No edema, tenderness   LABS: Results for orders placed or performed in visit on 05/09/15  POCT CBC  Result Value Ref Range   WBC 4.5 (A) 4.6 - 10.2 K/uL   Lymph, poc 2.0 0.6 - 3.4   POC LYMPH PERCENT 44.3 10 - 50 %L   MID (cbc) 0.3 0 - 0.9   POC MID % 5.7 0 - 12 %M   POC Granulocyte 2.2 2 - 6.9   Granulocyte percent 50.0 37 - 80 %G   RBC 4.32 4.04 - 5.48 M/uL   Hemoglobin 13.4 12.2 - 16.2 g/dL   HCT, POC 40.4 37.7 - 47.9 %   MCV 93.4 80 - 97 fL   MCH, POC 30.9 27 - 31.2 pg   MCHC 33.1 31.8 - 35.4 g/dL   RDW, POC 13.4 %   Platelet Count, POC 221 142 - 424 K/uL   MPV 7.1 0 - 99.8 fL  POCT UA - Microscopic Only  Result Value Ref Range   WBC, Ur, HPF, POC 2-4    RBC, urine, microscopic 1-3    Bacteria, U Microscopic trace    Mucus, UA neg    Epithelial cells, urine per micros 1-3    Crystals, Ur, HPF, POC neg    Casts, Ur, LPF, POC neg    Yeast, UA neg   POCT urinalysis dipstick  Result Value Ref Range   Color, UA yellow    Clarity, UA clear    Glucose, UA neg    Bilirubin, UA neg    Ketones, UA neg    Spec Grav, UA 1.020    Blood, UA trace    pH, UA 5.5    Protein, UA neg    Urobilinogen, UA 0.2    Nitrite, UA neg    Leukocytes, UA small (1+) (A) Negative     EKG/XRAY:   Primary read interpreted by Dr. Marin Comment at [facility]. No appreciable renal or ureteral stones, + bladder stones No free air, no obstruction Normal chest   ASSESSMENT/PLAN: Encounter Diagnoses  Name Primary?  . Right upper quadrant pain   . Nausea   . UTI (lower urinary tract infection) Yes   Will presumptively treat for UTI Labs pending Normal abd xray and labs, will monitor Advise to go to ER prn   Gross sideeffects, risk and benefits, and  alternatives of medications d/w patient. Patient is aware that all medications have potential sideeffects and we are unable to predict every sideeffect or drug-drug interaction that may occur.  Thao Le DO  05/09/2015 8:51 PM  05/13/15 LM about labs and urine cx and ask to cb if worsening sxs. May stop keflex.

## 2015-05-10 LAB — COMPLETE METABOLIC PANEL WITH GFR
ALT: 17 U/L (ref 6–29)
AST: 21 U/L (ref 10–35)
Alkaline Phosphatase: 52 U/L (ref 33–130)
BUN: 17 mg/dL (ref 7–25)
CO2: 21 mmol/L (ref 20–31)
Chloride: 103 mmol/L (ref 98–110)
GFR, Est African American: 71 mL/min (ref >=60)
GFR, Est Non African American: 61 mL/min (ref >=60)
Glucose, Bld: 91 mg/dL (ref 65–99)
Sodium: 140 mmol/L (ref 135–146)
Total Bilirubin: 0.4 mg/dL (ref 0.2–1.2)

## 2015-05-10 LAB — COMPLETE METABOLIC PANEL WITHOUT GFR
Albumin: 4.4 g/dL (ref 3.6–5.1)
Calcium: 9.7 mg/dL (ref 8.6–10.4)
Creat: 0.98 mg/dL (ref 0.50–0.99)
Potassium: 4 mmol/L (ref 3.5–5.3)
Total Protein: 6.9 g/dL (ref 6.1–8.1)

## 2015-05-10 LAB — LIPASE: Lipase: 22 U/L (ref 7–60)

## 2015-05-11 LAB — URINE CULTURE
Colony Count: NO GROWTH
Organism ID, Bacteria: NO GROWTH

## 2015-06-28 ENCOUNTER — Encounter: Payer: Self-pay | Admitting: Family Medicine

## 2015-08-01 ENCOUNTER — Encounter: Payer: BC Managed Care – PPO | Admitting: Urgent Care

## 2015-09-08 ENCOUNTER — Other Ambulatory Visit: Payer: Self-pay | Admitting: Family Medicine

## 2015-09-08 ENCOUNTER — Ambulatory Visit (INDEPENDENT_AMBULATORY_CARE_PROVIDER_SITE_OTHER): Payer: BC Managed Care – PPO | Admitting: Family Medicine

## 2015-09-08 VITALS — BP 126/80 | HR 99 | Temp 99.5°F | Resp 16 | Ht 69.0 in | Wt 149.4 lb

## 2015-09-08 DIAGNOSIS — M21821 Other specified acquired deformities of right upper arm: Secondary | ICD-10-CM

## 2015-09-08 DIAGNOSIS — M2170 Unequal limb length (acquired), unspecified site: Secondary | ICD-10-CM

## 2015-09-08 DIAGNOSIS — M436 Torticollis: Secondary | ICD-10-CM | POA: Diagnosis not present

## 2015-09-08 DIAGNOSIS — M62838 Other muscle spasm: Secondary | ICD-10-CM

## 2015-09-08 DIAGNOSIS — M6248 Contracture of muscle, other site: Secondary | ICD-10-CM

## 2015-09-08 MED ORDER — DIAZEPAM 5 MG PO TABS: 5.0000 mg | ORAL_TABLET | Freq: Two times a day (BID) | ORAL | Status: DC | PRN

## 2015-09-08 NOTE — Progress Notes (Addendum)
Subjective:  This chart was scribed for Delman Cheadle, MD by Moises Blood, Medical Scribe. This patient was seen in Room 1 and the patient's care was started 12:50 PM.   Patient ID: Rebecca Rebecca [name], Rebecca [name]    DOB: 05-29-1951, 64 y.o.   MRN: [id number] Chief Complaint  Patient presents with  . OTHER    neck spasms    HPI Rebecca Rebecca [name] is a 64 y.o. Rebecca [name] who presents to [facility] complaining of neck spasms that flared back up 2 months ago.  History She was seen 6 months ago for similar neck spasms, trapezius spasms and myalgia. [facility], and advised mobic and flexeril qhs.   Myalgia She feels a certain muscle strain not relaxing, with pain radiating over her right shoulder. She noticed it to tighten more 2 days ago. She tried using aleve bid for 2 weeks without much relief. She restarted taking the mobic and flexeril that I prescribed in May yesterday and this morning with some relief. She went to physical therapy for 2 months and was doing well so she stopped going. She also stopped doing the exercises at home. She denies going to a chiropractor and having massage therapy. She denies weakness in grip strength. She hasn't applied heat on the area for 2 days. She's also tried laying down without pillows to sleep straight and flat.   She fell twice since I last saw her, while walking her dogs. She lost her footing when dragged by her dogs. She isn't sure if this has contributed to the neck spasm flare up.   Medication She informs that the flexeril has slowed her down a bit. She denies complications with her medications.   Past Medical History  Diagnosis Date  . Menopause   . Murmur   . Osteopenia   . Poikiloderma   . Herpes zoster     2011  . Degenerative disk disease     C5-C6  . Tinnitus     L ear  . Colon polyp     2007  . Anxiety    Prior to Admission medications   Medication Sig Start Date End Date Taking? Authorizing Provider  cetirizine (ZYRTEC) 10 MG  tablet Take 1 tablet (10 mg total) by mouth daily. 02/13/15  Yes Tereasa Coop, PA-C  cyclobenzaprine (FLEXERIL) 10 MG tablet Take 1 tablet (10 mg total) by mouth 3 (three) times daily as needed for muscle spasms. 02/26/15  Yes Shawnee Knapp, MD  fluticasone (FLONASE) 50 MCG/ACT nasal spray Place 2 sprays into both nostrils daily. Take two sprays in each nostril daily. 02/13/15  Yes Tereasa Coop, PA-C  hydroxychloroquine (PLAQUENIL) 200 MG tablet Take by mouth 2 (two) times daily.   Yes Historical Provider, MD  lamoTRIgine (LAMICTAL) 150 MG tablet Take 150 mg by mouth daily.   Yes Historical Provider, MD  meloxicam (MOBIC) 7.5 MG tablet Take 1 tablet by mouth twice daily prn pain. 02/26/15  Yes Shawnee Knapp, MD  PRESCRIPTION MEDICATION Fluoconicide using three times a week   Yes Historical Provider, MD  cephALEXin (KEFLEX) 500 MG capsule Take 1 capsule (500 mg total) by mouth 2 (two) times daily. Patient not taking: Reported on 09/08/2015 05/09/15   Thao P Le, DO  OVER THE COUNTER MEDICATION Calcium w/D taking 2 a day    Historical Provider, MD  OVER THE COUNTER MEDICATION Vitamin D3 2000 mg taking daily    Historical Provider, MD  PRESCRIPTION MEDICATION Triluma cream daily  use    Historical Provider, MD   Allergies  Allergen Reactions  . Codeine Hives    Review of Systems  Constitutional: Positive for fatigue. Negative for fever, chills and diaphoresis.  Gastrointestinal: Negative for vomiting, diarrhea, constipation and rectal pain.  Musculoskeletal: Positive for myalgias, arthralgias, neck pain and neck stiffness.  Skin: Negative for rash and wound.       Objective:   Physical Exam  Constitutional: She is oriented to person, place, and time. She appears well-developed and well-nourished. No distress.  HENT:  Head: Normocephalic and atraumatic.  Eyes: EOM are normal. Pupils are equal, round, and reactive to light.  Neck: Neck supple.  Cardiovascular: Normal rate.   Pulmonary/Chest:  Effort normal. No respiratory distress.  Musculoskeletal:  Neck: Moderate restriction with flexion, and extension Severe restriction on left rotation, moderate restriction on rightt rotation. Severe restriction on left lateral flexion, moderate restriction on right lateral flexion  Right paraspinal spasm, Negative spurling's  Right shoulder is hilled 1 cm than left. No significant spasms felt over trapezius. Full rom of shoulders  Neurological: She is alert and oriented to person, place, and time.  Skin: Skin is warm and dry.  Psychiatric: She has a normal mood and affect. Her behavior is normal.  Nursing note and vitals reviewed.   BP 126/80 mmHg  Pulse 99  Temp(Src) 99.5 F (37.5 C) (Oral)  Resp 16  Ht 5\' 9"  (1.753 m)  Wt 149 lb 6.4 oz (67.767 kg)  BMI 22.05 kg/m2  SpO2 96%     Assessment & Plan:   1. Torticollis, acquired   2. Shoulder height discrepancy, right   3. Leg length discrepancy   4. Trapezius muscle spasm   Started on meloxicam 15 qam and flexeril 10mg  tid yesterday without any sig relief.  Try valium qhs instead of flexeril - can use flexeril 5mg  tid during day prn. Cont meloxicam. If pain continues, pt to call and would consider switching nsaid to indocin or even try prednisone taper.   See AVS - pt with chronic posture/alignment problems that are going to cause recurrent problems. Sent to [facility] yr which did help so pt will resume prior home exercises but could consider chiropractor, PMR physician, repeat PT, or other if continues.  Meds ordered this encounter  Medications  . diazepam (VALIUM) 5 MG tablet    Sig: Take 1 tablet (5 mg total) by mouth every 12 (twelve) hours as needed for muscle spasms.    Dispense:  30 tablet    Refill:  0    I personally performed the services described in this documentation, which was scribed in my presence. The recorded information has been reviewed and considered, and addended by me as needed.  Delman Cheadle, MD MPH  By signing my name below, I, Moises Blood, attest that this documentation has been prepared under the direction and in the presence of Delman Cheadle, MD. Electronically Signed: Moises Blood, [place]. 09/08/2015 , 12:50 PM .

## 2015-09-08 NOTE — Patient Instructions (Signed)
I think you may benefit from chiropractor evaluation and treatment.  Consider: Rebecca [name] and team at [facility] are great. Estill Batten at [facility] also have good results. Going back to [facility] could help. Seeing a physical medicine and rehabilitation physician such as Dr. Antonietta Jewel might help you  It is possible you could benefit from dry needling or other therapy to get that trigger point in your right shoulder/trapezius to release - a massage at [facility] would likely do that as well. If you continue to have pain, we could try a trigger point injection into the area where that spasm starts in the back of your right trapezius or we could step up to a stronger anti-inflammatory such as relafen rather than the mobic.    Acute Torticollis Torticollis is a condition in which the muscles of the neck tighten (contract) abnormally, causing the neck to twist and the head to move into an unnatural position. Torticollis that develops suddenly is called acute torticollis. If torticollis becomes chronic and is left untreated, the face and neck can become deformed. CAUSES This condition may be caused by: 1. Sleeping in an awkward position (common). 2. Extending or twisting the neck muscles beyond their normal position. 3. Infection. In some cases, the cause may not be known. SYMPTOMS Symptoms of this condition include:  An unnatural position of the head.  Neck pain.  A limited ability to move the neck.  Twisting of the neck to one side. DIAGNOSIS This condition is diagnosed with a physical exam. You may also have imaging tests, such as an X-ray, CT scan, or MRI. TREATMENT Treatment for this condition involves trying to relax the neck muscles. It may include:  Medicines or shots.  Physical therapy.  Surgery. This may be done in severe cases. HOME CARE INSTRUCTIONS 1. Take  medicines only as directed by your health care provider. 2. Do stretching exercises and massage your neck as directed by your health care provider. 3. Keep all follow-up visits as directed by your health care provider. This is important. SEEK MEDICAL CARE IF:  You develop a fever. SEEK IMMEDIATE MEDICAL CARE IF:  You develop difficulty breathing.  You develop noisy breathing (stridor).  You start drooling.  You have trouble swallowing or have pain with swallowing.  You develop numbness or weakness in your hands or feet.  You have changes in your speech, understanding, or vision.  Your pain gets worse.   This information is not intended to replace advice given to you by your health care provider. Make sure you discuss any questions you have with your health care provider.   Document Released: 09/18/2000 Document Revised: 02/05/2015 Document Reviewed: 09/17/2014 Elsevier Interactive Patient Education 2016 [employer]. Trigger Point Dry Needling   What is Trigger Point Dry Needling (DN)?   1. DN is a physical therapy technique used to treat muscle pain and     Dysfunction.  Specifically, DN helps deactivate muscle trigger    points (Muscle Knots).   2. A thin filiform needle is used to penetrate the skin and stimulate    the underlying trigger point.  The goal is for a local twitch     response (LTR) to occur and for the trigger point to relax.  No    medication of any kind is injected during the procedure.   What Does Trigger Point Dry Needling Feel Like?   1. The procedures feels different for each individual  patient.   Some    patients report that they do not actually feel the      needle enter the skin and overall the process is not  painful.  Very    mild bleeding may occur.  However, many patients feel a deep    cramping in the muscle in which the needle was inserted. This is t   he local twitch response.    How Will I Feel After The Treatment?   1. Soreness is normal,  and the onset of soreness may not occur for    a few hours.  Typically this soreness does not last longer than two    days.   2. Bruising is uncommon, however; ice can be used to decrease any    possible bruising.   3. In rare cases feeling tired or nauseous after the treatment is    normal.  In addition, your symptoms may get worse before they    get better, this period will typically not last longer than 24 hours.   What Can I do After My Treatment?   1.  Increase your hydration by drinking more water for the next 24    hours.   2.  You may place ice or heat on the areas treated that have become    sore, however don not use heat on inflamed or bruised areas.     Heat often brings more relief post needling.   3. You can continue your regular activities, but vigorous activity is    not recommended initially after the treatment for 24 hours.   4. DN is best combined with other physical therapy such as     strengthening, stretching, and other therapies.

## 2015-11-21 LAB — HM DIABETES EYE EXAM

## 2015-12-09 LAB — HM MAMMOGRAPHY

## 2015-12-18 ENCOUNTER — Encounter: Payer: Self-pay | Admitting: Family Medicine

## 2016-02-17 ENCOUNTER — Encounter: Payer: Self-pay | Admitting: *Deleted

## 2016-05-17 ENCOUNTER — Encounter (HOSPITAL_COMMUNITY): Payer: Self-pay | Admitting: Emergency Medicine

## 2016-05-17 ENCOUNTER — Ambulatory Visit (HOSPITAL_COMMUNITY)
Admission: EM | Admit: 2016-05-17 | Discharge: 2016-05-17 | Disposition: A | Discharge status: Home / Self Care | Payer: Medicare Other | Attending: Family Medicine | Admitting: Family Medicine

## 2016-05-17 NOTE — ED Notes (Signed)
Patient left treatment area, going to front dest to ask for a "credit"

## 2016-05-17 NOTE — ED Triage Notes (Signed)
Patient reports a month history of eyes being red and having a crusty discharge to both eyes when waking.  Denies itching, denies any cold symptoms.  Patient does wear contacts, but has tried to avoid contacts to see if this makes any difference.  Reports this has not made a difference

## 2016-06-05 ENCOUNTER — Ambulatory Visit (INDEPENDENT_AMBULATORY_CARE_PROVIDER_SITE_OTHER): Payer: Medicare Other | Admitting: Urgent Care

## 2016-06-05 ENCOUNTER — Encounter: Payer: Self-pay | Admitting: Urgent Care

## 2016-06-05 VITALS — BP 122/72 | HR 83 | Temp 98.3°F | Resp 17 | Ht 68.0 in | Wt 145.0 lb

## 2016-06-05 DIAGNOSIS — H65192 Other acute nonsuppurative otitis media, left ear: Secondary | ICD-10-CM | POA: Diagnosis not present

## 2016-06-05 DIAGNOSIS — J329 Chronic sinusitis, unspecified: Secondary | ICD-10-CM

## 2016-06-05 DIAGNOSIS — Z23 Encounter for immunization: Secondary | ICD-10-CM

## 2016-06-05 DIAGNOSIS — R05 Cough: Secondary | ICD-10-CM

## 2016-06-05 DIAGNOSIS — H578 Other specified disorders of eye and adnexa: Secondary | ICD-10-CM

## 2016-06-05 DIAGNOSIS — R059 Cough, unspecified: Secondary | ICD-10-CM

## 2016-06-05 DIAGNOSIS — R0982 Postnasal drip: Secondary | ICD-10-CM

## 2016-06-05 DIAGNOSIS — H5789 Other specified disorders of eye and adnexa: Secondary | ICD-10-CM

## 2016-06-05 MED ORDER — AZELASTINE HCL 0.05 % OP SOLN: 1.0000 [drp] | Freq: Two times a day (BID) | OPHTHALMIC | 12 refills | Status: DC

## 2016-06-05 NOTE — Progress Notes (Signed)
    MRN: [id number] DOB: 16-Apr-1951  Subjective:   Rebecca [name] is a 65 y.o. female presenting for chief complaint of Eye Burn  Reports 2 month history of worsening bilateral eye burning sensation. Symptoms are worst in the morning, has bloodshot eyes, pus like drainage of her eyes. Also has dry cough. Denies fever, foreign body sensation, photosensitivity, eye itching, headache, sinus congestion, sinus pain, ear pain, ear drainage, sore throat. Reports history of seasonal allergies, managed with Zyrtec but has not used this recently. Has 2 dogs at home. Denies smoking cigarettes. Drinks alcohol daily, 2 drinks.  Rebecca [name] has a current medication list which includes the following prescription(s): cetirizine, hydroxychloroquine, lamotrigine, and venlafaxine. Also is allergic to codeine.  Rebecca [name]  has a past medical history of Anxiety; Colon polyp; Degenerative disk disease; Herpes zoster; Menopause; Murmur; Osteopenia; Poikiloderma; and Tinnitus. Also  has a past surgical history that includes Hemorrhoid surgery.  Objective:   Vitals: BP 122/72 (BP Location: Right Arm, Patient Position: Sitting, Cuff Size: Normal)   Pulse 83   Temp 98.3 F (36.8 C) (Oral)   Resp 17   Ht 5\' 8"  (1.727 m)   Wt 145 lb (65.8 kg)   SpO2 98%   BMI 22.05 kg/m   Physical Exam  Constitutional: She is oriented to person, place, and time. She appears well-developed and well-nourished.  HENT:  Left TM with effusion, right TM without effusions or erythema. Nasal turbinates boggy edematous without sinus tenderness. Postnasal drip present but without oropharyngeal exudates, erythema or abscesses.  Eyes: EOM are normal. Pupils are equal, round, and reactive to light. Right eye exhibits no discharge. Left eye exhibits no discharge. No scleral icterus.  Neck: Normal range of motion. Neck supple.  Cardiovascular: Normal rate.   Pulmonary/Chest: Effort normal.  Lymphadenopathy:    She has no cervical adenopathy.    Neurological: She is alert and oriented to person, place, and time.  Skin: Skin is warm and dry.   Assessment and Plan :   1. Burning sensation in eye 2. Acute effusion of left ear 3. Post-nasal drainage 4. Cough - Symptoms consistent with allergic rhinitis. Will start aggressive allergy management. Patient will rtc on Tuesday if no improvement, consider antibiotic course at that point. In the meantime, patient will use Zyrtec, Flonase, Optivar, Sudafed.   5. Need for prophylactic vaccination and inoculation against influenza - Flu Vaccine QUAD 36+ mos IM   Jaynee Eagles, PA-C [facility] [phone] 06/05/2016 11:00 AM

## 2016-06-05 NOTE — Patient Instructions (Addendum)
Allergic Rhinitis Allergic rhinitis is when the mucous membranes in the nose respond to allergens. Allergens are particles in the air that cause your body to have an allergic reaction. This causes you to release allergic antibodies. Through a chain of events, these eventually cause you to release histamine into the blood stream. Although meant to protect the body, it is this release of histamine that causes your discomfort, such as frequent sneezing, congestion, and an itchy, runny nose.  CAUSES Seasonal allergic rhinitis (hay fever) is caused by pollen allergens that may come from grasses, trees, and weeds. Year-round allergic rhinitis (perennial allergic rhinitis) is caused by allergens such as house dust mites, pet dander, and mold spores. SYMPTOMS  Nasal stuffiness (congestion).  Itchy, runny nose with sneezing and tearing of the eyes. DIAGNOSIS Your health care provider can help you determine the allergen or allergens that trigger your symptoms. If you and your health care provider are unable to determine the allergen, skin or blood testing may be used. Your health care provider will diagnose your condition after taking your health history and performing a physical exam. Your health care provider may assess you for other related conditions, such as asthma, pink eye, or an ear infection. TREATMENT Allergic rhinitis does not have a cure, but it can be controlled by:  Medicines that block allergy symptoms. These may include allergy shots, nasal sprays, and oral antihistamines.  Avoiding the allergen. Hay fever may often be treated with antihistamines in pill or nasal spray forms. Antihistamines block the effects of histamine. There are over-the-counter medicines that may help with nasal congestion and swelling around the eyes. Check with your health care provider before taking or giving this medicine. If avoiding the allergen or the medicine prescribed do not work, there are many new medicines  your health care provider can prescribe. Stronger medicine may be used if initial measures are ineffective. Desensitizing injections can be used if medicine and avoidance does not work. Desensitization is when a patient is given ongoing shots until the body becomes less sensitive to the allergen. Make sure you follow up with your health care provider if problems continue. HOME CARE INSTRUCTIONS It is not possible to completely avoid allergens, but you can reduce your symptoms by taking steps to limit your exposure to them. It helps to know exactly what you are allergic to so that you can avoid your specific triggers. SEEK MEDICAL CARE IF:  You have a fever.  You develop a cough that does not stop easily (persistent).  You have shortness of breath.  You start wheezing.  Symptoms interfere with normal daily activities.   This information is not intended to replace advice given to you by your health care provider. Make sure you discuss any questions you have with your health care provider.   Document Released: 06/16/2001 Document Revised: 10/12/2014 Document Reviewed: 05/29/2013 Elsevier Interactive Patient Education 2016 [place] may also Sudafed for congestion. You can use 60mg  every 6 hours or 120mg  every 12 hours.  Azelastine eye solution What is this medicine? AZELASTINE (a ZEL as teen) is an antihistamine. It is used in the eye to treat itching of eyes caused by hay fever or other allergies. This medicine may be used for other purposes; ask your health care provider or pharmacist if you have questions. What should I tell my health care provider before I take this medicine? They need to know if you have any of these conditions: -wear contact lenses -an unusual or  allergic reaction to azelastine, other medicines, foods, dyes, or preservatives -pregnant or trying to become pregnant -breast-feeding How should I use this medicine? This medicine is only for use in the eye.  Do not take by mouth. Follow the directions on the prescription label. Wash hands before and after use. Tilt the head back slightly and pull down the lower eyelid with your index finger to form a pouch. Try not to touch the tip of the dropper to your eye, fingertips, or any other surface. Squeeze the prescribed number of drops (usually 1 drop) into the pouch. Close the eye gently. Do not blink. Use your doses at regular intervals. Do not use your medicine more often than directed. If you use other eye medicines, they should be used at least 10 minutes before or after this medicine. Eye ointments should be applied last. Talk to your pediatrician regarding the use of this medicine in children. Special care may be needed. While this drug may be prescribed for children as young as 65 years of age for selected conditions, precautions do apply. Overdosage: If you think you have taken too much of this medicine contact a poison control center or emergency room at once. NOTE: This medicine is only for you. Do not share this medicine with others. What if I miss a dose? If you miss a dose, use it as soon as you can. If it is almost time for your next dose, use only that dose. Do not use double or extra doses. What may interact with this medicine? Interactions are not expected. Do not use any other eye products without telling your doctor or health care professional. This list may not describe all possible interactions. Give your health care provider a list of all the medicines, herbs, non-prescription drugs, or dietary supplements you use. Also tell them if you smoke, drink alcohol, or use illegal drugs. Some items may interact with your medicine. What should I watch for while using this medicine? Tell your doctor or health care professional if your symptoms do not start to get better within 2 or 3 days. Report any serious side effects promptly. Stop using this medicine if your eyes get swollen, painful, or have a  discharge, and see your doctor or health care professional as soon as you can. Contact lenses may be inserted 10 minutes after putting the medicine in the eye. Do not wear contact lenses if your eyes are red. You should not use this medicine to treat irritation that is caused by contact lenses. What side effects may I notice from receiving this medicine? Side effects that you should report to your doctor or health care professional as soon as possible: -allergic reactions like skin rash, itching or hives, swelling of the face, lips, or tongue Side effects that usually do not require medical attention (report to your doctor or health care professional if they continue or are bothersome): -bitter taste -blurred vision -eye pain, burning or stinging -fatigue -headache This list may not describe all possible side effects. Call your doctor for medical advice about side effects. You may report side effects to FDA at 1-800-FDA-1088. Where should I keep my medicine? Keep out of the reach of children. Keep container tightly closed when not in use. Store upright between 2 and 25 degrees C (36 to 77 degrees F). Throw away any unused medicine after the expiration date. NOTE: This sheet is a summary. It may not cover all possible information. If you have questions about this medicine, talk to  your doctor, pharmacist, or health care provider.    2016, Elsevier/Gold Standard. (2007-12-05 14:29:55)     IF you received an x-ray today, you will receive an invoice from [facility]. Please contact [facility] at [phone] with questions or concerns regarding your invoice.   IF you received labwork today, you will receive an invoice from [employer]. Please contact [facility] at [phone] with questions or concerns regarding your invoice.   Our billing staff will not be able to assist you with questions regarding bills from these companies.  You will be  contacted with the lab results as soon as they are available. The fastest way to get your results is to activate your My Chart account. Instructions are located on the last page of this paperwork. If you have not heard from [place] regarding the results in 2 weeks, please contact this office.     Influenza (Flu) Vaccine (Inactivated or Recombinant):  1. Why get vaccinated? Influenza ("flu") is a contagious disease that spreads around the [place] every year, usually between October and May. Flu is caused by influenza viruses, and is spread mainly by coughing, sneezing, and close contact. Anyone can get flu. Flu strikes suddenly and can last several days. Symptoms vary by age, but can include:  fever/chills  sore throat  muscle aches  fatigue  cough  headache  runny or stuffy nose Flu can also lead to pneumonia and blood infections, and cause diarrhea and seizures in children. If you have a medical condition, such as heart or lung disease, flu can make it worse. Flu is more dangerous for some people. Infants and young children, people 21 years of age and older, pregnant women, and people with certain health conditions or a weakened immune system are at greatest risk. Each year thousands of people in the [place] die from flu, and many more are hospitalized. Flu vaccine can:  keep you from getting flu,  make flu less severe if you do get it, and  keep you from spreading flu to your family and other people. 2. Inactivated and recombinant flu vaccines A dose of flu vaccine is recommended every flu season. Children 6 months through 42 years of age may need two doses during the same flu season. Everyone else needs only one dose each flu season. Some inactivated flu vaccines contain a very small amount of a mercury-based preservative called thimerosal. Studies have not shown thimerosal in vaccines to be harmful, but flu vaccines that do not contain thimerosal are available. There is  no live flu virus in flu shots. They cannot cause the flu. There are many flu viruses, and they are always changing. Each year a new flu vaccine is made to protect against three or four viruses that are likely to cause disease in the upcoming flu season. But even when the vaccine doesn't exactly match these viruses, it may still provide some protection. Flu vaccine cannot prevent:  flu that is caused by a virus not covered by the vaccine, or  illnesses that look like flu but are not. It takes about 2 weeks for protection to develop after vaccination, and protection lasts through the flu season. 3. Some people should not get this vaccine Tell the person who is giving you the vaccine:  If you have any severe, life-threatening allergies. If you ever had a life-threatening allergic reaction after a dose of flu vaccine, or have a severe allergy to any part of this vaccine, you may be advised not  to get vaccinated. Most, but not all, types of flu vaccine contain a small amount of egg protein.  If you ever had Guillain-Barre Syndrome (also called GBS). Some people with a history of GBS should not get this vaccine. This should be discussed with your doctor.  If you are not feeling well. It is usually okay to get flu vaccine when you have a mild illness, but you might be asked to come back when you feel better. 4. Risks of a vaccine reaction With any medicine, including vaccines, there is a chance of reactions. These are usually mild and go away on their own, but serious reactions are also possible. Most people who get a flu shot do not have any problems with it. Minor problems following a flu shot include:  soreness, redness, or swelling where the shot was given  hoarseness  sore, red or itchy eyes  cough  fever  aches  headache  itching  fatigue If these problems occur, they usually begin soon after the shot and last 1 or 2 days. More serious problems following a flu shot can include  the following:  There may be a small increased risk of Guillain-Barre Syndrome (GBS) after inactivated flu vaccine. This risk has been estimated at 1 or 2 additional cases per million people vaccinated. This is much lower than the risk of severe complications from flu, which can be prevented by flu vaccine.  Young children who get the flu shot along with pneumococcal vaccine (PCV13) and/or DTaP vaccine at the same time might be slightly more likely to have a seizure caused by fever. Ask your doctor for more information. Tell your doctor if a child who is getting flu vaccine has ever had a seizure. Problems that could happen after any injected vaccine:  People sometimes faint after a medical procedure, including vaccination. Sitting or lying down for about 15 minutes can help prevent fainting, and injuries caused by a fall. Tell your doctor if you feel dizzy, or have vision changes or ringing in the ears.  Some people get severe pain in the shoulder and have difficulty moving the arm where a shot was given. This happens very rarely.  Any medication can cause a severe allergic reaction. Such reactions from a vaccine are very rare, estimated at about 1 in a million doses, and would happen within a few minutes to a few hours after the vaccination. As with any medicine, there is a very remote chance of a vaccine causing a serious injury or death. The safety of vaccines is always being monitored. For more information, visit: http://www.aguilar.org/ 5. What if there is a serious reaction? What should I look for?  Look for anything that concerns you, such as signs of a severe allergic reaction, very high fever, or unusual behavior. Signs of a severe allergic reaction can include hives, swelling of the face and throat, difficulty breathing, a fast heartbeat, dizziness, and weakness. These would start a few minutes to a few hours after the vaccination. What should I do?  If you think it is a severe  allergic reaction or other emergency that can't wait, call 9-1-1 and get the person to the nearest hospital. Otherwise, call your doctor.  Reactions should be reported to the Vaccine Adverse Event Reporting System (VAERS). Your doctor should file this report, or you can do it yourself through the VAERS web site at www.vaers.SamedayNews.es, or by calling (660)407-4448. VAERS does not give medical advice. 6. The National Vaccine Injury [employer]  Vaccine Injury Compensation Program (VICP) is a federal program that was created to compensate people who may have been injured by certain vaccines. Persons who believe they may have been injured by a vaccine can learn about the program and about filing a claim by calling 704-066-9946 or visiting the [place] website at GoldCloset.com.ee. There is a time limit to file a claim for compensation. 7. How can I learn more?  Ask your healthcare provider. He or she can give you the vaccine package insert or suggest other sources of information.  Call your local or state health department.  Contact the Centers for Disease Control and Prevention (CDC):  Call 786-321-9931 (1-800-CDC-INFO) or  Visit CDC's website at https://gibson.com/ Vaccine Information Statement Inactivated Influenza Vaccine (05/11/2014)   This information is not intended to replace advice given to you by your health care provider. Make sure you discuss any questions you have with your health care provider.   Document Released: 07/16/2006 Document Revised: 10/12/2014 Document Reviewed: 05/14/2014 Elsevier Interactive Patient Education [employer].

## 2016-06-19 ENCOUNTER — Ambulatory Visit (INDEPENDENT_AMBULATORY_CARE_PROVIDER_SITE_OTHER): Payer: Medicare Other | Admitting: Family Medicine

## 2016-06-19 VITALS — BP 102/64 | HR 89 | Temp 98.7°F | Resp 18 | Ht 68.0 in | Wt 144.0 lb

## 2016-06-19 DIAGNOSIS — J309 Allergic rhinitis, unspecified: Secondary | ICD-10-CM

## 2016-06-19 DIAGNOSIS — H65192 Other acute nonsuppurative otitis media, left ear: Secondary | ICD-10-CM

## 2016-06-19 NOTE — Patient Instructions (Addendum)
Continue Zyrtec 10 mg daily and Flonase nasal spray.     IF you received an x-ray today, you will receive an invoice from [facility]. Please contact [facility] at [phone] with questions or concerns regarding your invoice.   IF you received labwork today, you will receive an invoice from [employer]. Please contact [facility] at [phone] with questions or concerns regarding your invoice.   Our billing staff will not be able to assist you with questions regarding bills from these companies.  You will be contacted with the lab results as soon as they are available. The fastest way to get your results is to activate your My Chart account. Instructions are located on the last page of this paperwork. If you have not heard from [place] regarding the results in 2 weeks, please contact this office.

## 2016-06-19 NOTE — Progress Notes (Addendum)
Patient ID: Rebecca [name], female    DOB: 07-29-1951, 65 y.o.   MRN: [id number]  PCP: No PCP Per Patient  Chief Complaint  Patient presents with  . Follow-up    SINUS INFECTION    Subjective:   HPI 65 year old recheck of allergy symptoms.  Reports overall improvement of symptoms with the exception of persistent left ear fullness. She was seen 06/05/16 and treated for post nasal drip, an acute effusion of left er, burning of eyes, and cough. She continues taking  all of the same course of treatments originally prescribed. Reports no more fatigue and resolved cough. Eyes are absent of crusting although she reports awakening with red eyes that improve during the morning. Reports a recent unremarkable eye exam.Feels like nasal passage is clear and improved with flonase.    Social History   Social History  . Marital status: Married    Spouse name: N/A  . Number of children: N/A  . Years of education: N/A   Occupational History  . Forensic scientist Employed   Social History Main Topics  . Smoking status: Former Smoker    Packs/day: 1.00    Types: Cigarettes    Quit date: 11/04/1978  . Smokeless tobacco: Never Used  . Alcohol use 0.0 oz/week     Comment:  usually 1 drink nightly  . Drug use: No  . Sexual activity: Yes   Other Topics Concern  . Not on file   Social History Narrative  . No narrative on file    History reviewed. No pertinent family history. Review of Systems See HPI  Patient Active Problem List   Diagnosis Date Noted  . Lichen planus [id number]  . Anxiety disorder 11/05/2011  . Menopause   . Murmur   . Poikiloderma   . Herpes zoster   . Degenerative disk disease   . Tinnitus   . Colon polyp      Prior to Admission medications   Medication Sig Start Date End Date Taking? Authorizing Provider  azelastine (OPTIVAR) 0.05 % ophthalmic solution Place 1 drop into both eyes 2 (two) times daily. 06/05/16  Yes Jaynee Eagles, PA-C  cetirizine (ZYRTEC) 10 MG  tablet Take 1 tablet (10 mg total) by mouth daily. 02/13/15  Yes Tereasa Coop, PA-C  hydroxychloroquine (PLAQUENIL) 200 MG tablet Take by mouth 2 (two) times daily.   Yes Historical Provider, MD  lamoTRIgine (LAMICTAL) 150 MG tablet Take 150 mg by mouth daily.   Yes Historical Provider, MD  venlafaxine (EFFEXOR) 37.5 MG tablet Take 37.5 mg by mouth once.   Yes Historical Provider, MD     Allergies  Allergen Reactions  . Codeine Hives     Objective:  Physical Exam  Constitutional: She is oriented to person, place, and time. She appears well-developed and well-nourished.  HENT:  Head: Normocephalic and atraumatic.  Right Ear: External ear normal.  Left Ear: External ear normal.  Nose: Nose normal.  Mouth/Throat: Oropharynx is clear and moist.  Eyes: Conjunctivae and EOM are normal. Pupils are equal, round, and reactive to light.  Neck: Normal range of motion. Neck supple.  Cardiovascular: Normal rate, regular rhythm, normal heart sounds and intact distal pulses.   Pulmonary/Chest: Effort normal and breath sounds normal.  Musculoskeletal: Normal range of motion.  Neurological: She is alert and oriented to person, place, and time. She has normal reflexes.  Skin: Skin is warm and dry.  Psychiatric: She has a normal mood and affect. Her behavior is normal.  Judgment and thought content normal.     Vitals:   06/19/16 1406  BP: 102/64  Pulse: 89  Resp: 18  Temp: 98.7 F (37.1 C)   Assessment & Plan:  1. Allergic rhinitis, unspecified allergic rhinitis type, Controlled 2. Acute otitis effusion, Improving   Continue Zyrtec 10 mg and Flonase nasal 2 sprays per nares.  Follow-up as needed.  Carroll Sage. Kenton Kingfisher, MSN, FNP-C [facility]

## 2016-08-11 ENCOUNTER — Ambulatory Visit (INDEPENDENT_AMBULATORY_CARE_PROVIDER_SITE_OTHER): Payer: Medicare Other | Admitting: Family Medicine

## 2016-08-11 VITALS — BP 116/76 | HR 72 | Temp 98.0°F | Resp 16 | Wt 148.8 lb

## 2016-08-11 DIAGNOSIS — Z780 Asymptomatic menopausal state: Secondary | ICD-10-CM

## 2016-08-11 DIAGNOSIS — Z0000 Encounter for general adult medical examination without abnormal findings: Secondary | ICD-10-CM | POA: Diagnosis not present

## 2016-08-11 DIAGNOSIS — Z5181 Encounter for therapeutic drug level monitoring: Secondary | ICD-10-CM

## 2016-08-11 LAB — POCT URINALYSIS DIP (MANUAL ENTRY)
BILIRUBIN UA: NEGATIVE
BILIRUBIN UA: NEGATIVE
Glucose, UA: NEGATIVE
Nitrite, UA: NEGATIVE
PH UA: 6
Protein Ur, POC: NEGATIVE
RBC UA: NEGATIVE
Spec Grav, UA: 1.01
Urobilinogen, UA: 0.2

## 2016-08-11 NOTE — Progress Notes (Signed)
QUICK REFERENCE INFORMATION:  The ABCs of Providing the Initial Preventive Physical Examination CMS.gov Medicare Learning Network  Welcome To [employer] [facility] Rebecca [name] is a 65 y.o. female who presents for a Welcome to Medicare exam.   Patient Active Problem List   Diagnosis Date Noted  . Lichen planus 16/07/9603  . Anxiety disorder 11/05/2011  . Menopause   . Murmur   . Poikiloderma   . Herpes zoster   . Degenerative disk disease   . Tinnitus   . Colon polyp     Past Medical History:  Diagnosis Date  . Anxiety   . Colon polyp    2007  . Degenerative disk disease    C5-C6  . Herpes zoster    2011  . Menopause   . Murmur   . Osteopenia   . Poikiloderma   . Tinnitus    L ear   Past Surgical History:  Procedure Laterality Date  . HEMORRHOID SURGERY      Outpatient Medications Prior to Visit  Medication Sig Dispense Refill  . azelastine (OPTIVAR) 0.05 % ophthalmic solution Place 1 drop into both eyes 2 (two) times daily. 6 mL 12  . cetirizine (ZYRTEC) 10 MG tablet Take 1 tablet (10 mg total) by mouth daily. 30 tablet 11  . hydroxychloroquine (PLAQUENIL) 200 MG tablet Take by mouth 2 (two) times daily.    Marland Kitchen lamoTRIgine (LAMICTAL) 150 MG tablet Take 150 mg by mouth daily.    Marland Kitchen venlafaxine (EFFEXOR) 37.5 MG tablet Take 37.5 mg by mouth once.     No facility-administered medications prior to visit.    Allergies  Allergen Reactions  . Codeine Hives   No family history on file.   Social History   Social History  . Marital status: Married    Spouse name: N/A  . Number of children: N/A  . Years of education: N/A   Occupational History  . Forensic scientist Employed   Social History Main Topics  . Smoking status: Former Smoker    Packs/day: 1.00    Types: Cigarettes    Quit date: 11/04/1978  . Smokeless tobacco: Never Used  . Alcohol use 0.0 oz/week     Comment:  usually 1 drink nightly  . Drug use: No  . Sexual activity: Yes   Other Topics  Concern  . None   Social History Narrative  . None     Review of Systems  Constitutional: Negative for chills and fever.  Respiratory: Negative for cough and wheezing.   Cardiovascular: Negative for chest pain, palpitations and orthopnea.  Gastrointestinal: Negative for abdominal pain, nausea and vomiting.  Skin: Negative for itching and rash.  Neurological: Negative for dizziness, tingling and headaches.    Recent Hospitalizations? no  Current Medical Providers and Suppliers: [facility] Patient Care Team: No Pcp Per Patient as PCP - General (General Practice) No future appointments.   Age-appropriate Screening Schedule: The list below includes current immunization status and future screening recommendations based on patient's age. Orders for these recommended tests are listed in the plan section. The patient has been provided with a written plan. Immunization History  Administered Date(s) Administered  . DTaP 09/23/2009  . Hepatitis A 09/23/2009, 08/21/2010  . Influenza,inj,Quad PF,36+ Mos 07/27/2013, 06/05/2016  . Zoster 03/06/2011   Health Maintenance  Topic Date Due  . HIV Screening  10/[birth year]/1965  . TETANUS/TDAP  10/29/1969  . COLONOSCOPY  10/29/2000  . DEXA SCAN  10/30/2015  . PNA vac Low Risk Adult (1 of  2 - PCV13) 10/30/2015  . PAP SMEAR  11/15/2016  . MAMMOGRAM  12/08/2017  . INFLUENZA VACCINE  Completed  . ZOSTAVAX  Completed  . Hepatitis C Screening  Completed    Health Habits  Exercise: is active and takes stairs   Depression screen [facility] 2/9 08/11/2016 06/19/2016 09/08/2015  Decreased Interest 0 0 0  Down, Depressed, Hopeless 0 0 0  PHQ - 2 Score 0 0 0    Depression Severity and Treatment Recommendations:  0-4= None  5-9= Mild / Treatment: Support, educate to call if worse; return in one month  10-14= Moderate / Treatment: Support, watchful waiting; Antidepressant or Psycotherapy  15-19= Moderately severe / Treatment: Antidepressant OR Psychotherapy  >=  20 = Major depression, severe / Antidepressant AND Psychotherapy  Is the patient deaf or have difficulty hearing?: No Does the patient have difficulty seeing, even when wearing glasses/contacts?: No Does the patient have difficulty concentrating, remembering, or making decisions?: No Does the patient have difficulty walking or climbing stairs?: No Does the patient have difficulty dressing or bathing?: No Does the patient have difficulty doing errands alone such as visiting a doctor's office or shopping?: No Functional Status Survey: Is the patient deaf or have difficulty hearing?: No Does the patient have difficulty seeing, even when wearing glasses/contacts?: No Does the patient have difficulty concentrating, remembering, or making decisions?: No Does the patient have difficulty walking or climbing stairs?: No Does the patient have difficulty dressing or bathing?: No Does the patient have difficulty doing errands alone such as visiting a doctor's office or shopping?: No  Safety Screen  Does the home have:  Rugs in the hallway: yes Stairs in home: yes  Handrails on the stairs: yes Poor lighting: no   Hearing Evaluation  Do you have trouble hearing the television when others do not? no  Do you have to strain to hear/understand conversations? no   Falls Risk:  Does the patient need assistance with ambulation? no  Does the patient have a history of a fall in the last 90 days? no Is the patient at risk for falls? no Was the patient's timed "Get Up and Go Test" unsteady or longer than 30 seconds? no   Advanced Care Planning Patient has executed an Advance Directive: yes  If no, patient was given the opportunity to execute an Advance Directive today? n/a  Are the patient's advanced directives in epic? no  This patient has the ability to prepare an Advance Directive: yes Provider is willing to follow the patient's wishes: yes   Cognitive Assessment Does the patient have evidence of  cognitive impairment? no The patient does not have evidence of a change in mood/affect, appearance,  speech, memory or motor skills.   Objective:   Vitals:   08/11/16 1753  BP: 116/76  Pulse: 72  Resp: 16  Temp: 98 F (36.7 C)  TempSrc: Oral  SpO2: 99%  Weight: 148 lb 12.8 oz (67.5 kg)    Body mass index is 22.62 kg/m.   Hearing/Vision exam:  Visual Acuity Screening   Right eye Left eye Both eyes  Without correction:     With correction: '20/20 20/50 20/20 '   Physical Exam  Constitutional: She is oriented to person, place, and time. She appears well-developed and well-nourished.  Eyes: Conjunctivae and EOM are normal.  Cardiovascular: Normal rate and regular rhythm.   Murmur heard. Pulmonary/Chest: Effort normal and breath sounds normal. No respiratory distress.  Abdominal: Soft. Bowel sounds are normal. She exhibits no  distension.  Musculoskeletal: Normal range of motion. She exhibits no edema.  Neurological: She is alert and oriented to person, place, and time. She displays normal reflexes.    Assessment:  Welcome To Medicare Exam     Plan:  During the course of the visit the patient was educated and counseled about appropriate screening and preventive services including:  -  Screening ECG -  Screening lipid test -  Screening for anemia  Also checked her wbc and rbc with a cmp since she is on plaquenil   Discussed the patient's BMI with her. The BMI BMI is in the acceptable range    The following orders were placed at today's visit;  Orders Placed This Encounter  Procedures  . CBC  . Comprehensive metabolic panel  . Lipid panel  . POCT urinalysis dipstick  . EKG 12-Lead     No Follow-up on file.  No future appointments.  Patient Instructions       IF you received an x-ray today, you will receive an invoice from [facility]. Please contact [facility] at [phone] with questions or concerns regarding your invoice.   IF you  received labwork today, you will receive an invoice from [employer]. Please contact [facility] at [phone] with questions or concerns regarding your invoice.   Our billing staff will not be able to assist you with questions regarding bills from these companies.  You will be contacted with the lab results as soon as they are available. The fastest way to get your results is to activate your My Chart account. Instructions are located on the last page of this paperwork. If you have not heard from [place] regarding the results in 2 weeks, please contact this office.     Preventive Care for Adults, Female A healthy lifestyle and preventive care can promote health and wellness. Preventive health guidelines for women include the following key practices.  A routine yearly physical is a good way to check with your health care provider about your health and preventive screening. It is a chance to share any concerns and updates on your health and to receive a thorough exam.  Visit your dentist for a routine exam and preventive care every 6 months. Brush your teeth twice a day and floss once a day. Good oral hygiene prevents tooth decay and gum disease.  The frequency of eye exams is based on your age, health, family medical history, use of contact lenses, and other factors. Follow your health care provider's recommendations for frequency of eye exams.  Eat a healthy diet. Foods like vegetables, fruits, whole grains, low-fat dairy products, and lean protein foods contain the nutrients you need without too many calories. Decrease your intake of foods high in solid fats, added sugars, and salt. Eat the right amount of calories for you.Get information about a proper diet from your health care provider, if necessary.  Regular physical exercise is one of the most important things you can do for your health. Most adults should get at least 150 minutes of moderate-intensity exercise (any  activity that increases your heart rate and causes you to sweat) each week. In addition, most adults need muscle-strengthening exercises on 2 or more days a week.  Maintain a healthy weight. The body mass index (BMI) is a screening tool to identify possible weight problems. It provides an estimate of body fat based on height and weight. Your health care provider can find your BMI and can help you achieve or maintain  a healthy weight.For adults 20 years and older:  A BMI below 18.5 is considered underweight.  A BMI of 18.5 to 24.9 is normal.  A BMI of 25 to 29.9 is considered overweight.  A BMI of 30 and above is considered obese.  Maintain normal blood lipids and cholesterol levels by exercising and minimizing your intake of saturated fat. Eat a balanced diet with plenty of fruit and vegetables. Blood tests for lipids and cholesterol should begin at age 75 and be repeated every 5 years. If your lipid or cholesterol levels are high, you are over 50, or you are at high risk for heart disease, you may need your cholesterol levels checked more frequently.Ongoing high lipid and cholesterol levels should be treated with medicines if diet and exercise are not working.  If you smoke, find out from your health care provider how to quit. If you do not use tobacco, do not start.  Lung cancer screening is recommended for adults aged 35-80 years who are at high risk for developing lung cancer because of a history of smoking. A yearly low-dose CT scan of the lungs is recommended for people who have at least a 30-pack-year history of smoking and are a current smoker or have quit within the past 15 years. A pack year of smoking is smoking an average of 1 pack of cigarettes a day for 1 year (for example: 1 pack a day for 30 years or 2 packs a day for 15 years). Yearly screening should continue until the smoker has stopped smoking for at least 15 years. Yearly screening should be stopped for people who develop a  health problem that would prevent them from having lung cancer treatment.  If you are pregnant, do not drink alcohol. If you are breastfeeding, be very cautious about drinking alcohol. If you are not pregnant and choose to drink alcohol, do not have more than 1 drink per day. One drink is considered to be 12 ounces (355 mL) of beer, 5 ounces (148 mL) of wine, or 1.5 ounces (44 mL) of liquor.  Avoid use of street drugs. Do not share needles with anyone. Ask for help if you need support or instructions about stopping the use of drugs.  High blood pressure causes heart disease and increases the risk of stroke. Your blood pressure should be checked at least every 1 to 2 years. Ongoing high blood pressure should be treated with medicines if weight loss and exercise do not work.  If you are 22-61 years old, ask your health care provider if you should take aspirin to prevent strokes.  Diabetes screening is done by taking a blood sample to check your blood glucose level after you have not eaten for a certain period of time (fasting). If you are not overweight and you do not have risk factors for diabetes, you should be screened once every 3 years starting at age 62. If you are overweight or obese and you are 9-76 years of age, you should be screened for diabetes every year as part of your cardiovascular risk assessment.  Breast cancer screening is essential preventive care for women. You should practice "breast self-awareness." This means understanding the normal appearance and feel of your breasts and may include breast self-examination. Any changes detected, no matter how small, should be reported to a health care provider. Women in their 100s and 30s should have a clinical breast exam (CBE) by a health care provider as part of a regular health exam every 1  to 3 years. After age 52, women should have a CBE every year. Starting at age 19, women should consider having a mammogram (breast X-ray test) every year.  Women who have a family history of breast cancer should talk to their health care provider about genetic screening. Women at a high risk of breast cancer should talk to their health care providers about having an MRI and a mammogram every year.  Breast cancer gene (BRCA)-related cancer risk assessment is recommended for women who have family members with BRCA-related cancers. BRCA-related cancers include breast, ovarian, tubal, and peritoneal cancers. Having family members with these cancers may be associated with an increased risk for harmful changes (mutations) in the breast cancer genes BRCA1 and BRCA2. Results of the assessment will determine the need for genetic counseling and BRCA1 and BRCA2 testing.  Your health care provider may recommend that you be screened regularly for cancer of the pelvic organs (ovaries, uterus, and vagina). This screening involves a pelvic examination, including checking for microscopic changes to the surface of your cervix (Pap test). You may be encouraged to have this screening done every 3 years, beginning at age 24.  For women ages 97-65, health care providers may recommend pelvic exams and Pap testing every 3 years, or they may recommend the Pap and pelvic exam, combined with testing for human papilloma virus (HPV), every 5 years. Some types of HPV increase your risk of cervical cancer. Testing for HPV may also be done on women of any age with unclear Pap test results.  Other health care providers may not recommend any screening for nonpregnant women who are considered low risk for pelvic cancer and who do not have symptoms. Ask your health care provider if a screening pelvic exam is right for you.  If you have had past treatment for cervical cancer or a condition that could lead to cancer, you need Pap tests and screening for cancer for at least 20 years after your treatment. If Pap tests have been discontinued, your risk factors (such as having a new sexual partner)  need to be reassessed to determine if screening should resume. Some women have medical problems that increase the chance of getting cervical cancer. In these cases, your health care provider may recommend more frequent screening and Pap tests.  Colorectal cancer can be detected and often prevented. Most routine colorectal cancer screening begins at the age of 44 years and continues through age 109 years. However, your health care provider may recommend screening at an earlier age if you have risk factors for colon cancer. On a yearly basis, your health care provider may provide home test kits to check for hidden blood in the stool. Use of a small camera at the end of a tube, to directly examine the colon (sigmoidoscopy or colonoscopy), can detect the earliest forms of colorectal cancer. Talk to your health care provider about this at age 61, when routine screening begins. Direct exam of the colon should be repeated every 5-10 years through age 46 years, unless early forms of precancerous polyps or small growths are found.  People who are at an increased risk for hepatitis B should be screened for this virus. You are considered at high risk for hepatitis B if:  You were born in a country where hepatitis B occurs often. Talk with your health care provider about which countries are considered high risk.  Your parents were born in a high-risk country and you have not received a shot to protect against  hepatitis B (hepatitis B vaccine).  You have HIV or AIDS.  You use needles to inject street drugs.  You live with, or have sex with, someone who has hepatitis B.  You get hemodialysis treatment.  You take certain medicines for conditions like cancer, organ transplantation, and autoimmune conditions.  Hepatitis C blood testing is recommended for all people born from [birth year] through 1965 and any individual with known risks for hepatitis C.  Practice safe sex. Use condoms and avoid high-risk sexual  practices to reduce the spread of sexually transmitted infections (STIs). STIs include gonorrhea, chlamydia, syphilis, trichomonas, herpes, HPV, and human immunodeficiency virus (HIV). Herpes, HIV, and HPV are viral illnesses that have no cure. They can result in disability, cancer, and death.  You should be screened for sexually transmitted illnesses (STIs) including gonorrhea and chlamydia if:  You are sexually active and are younger than 24 years.  You are older than 24 years and your health care provider tells you that you are at risk for this type of infection.  Your sexual activity has changed since you were last screened and you are at an increased risk for chlamydia or gonorrhea. Ask your health care provider if you are at risk.  If you are at risk of being infected with HIV, it is recommended that you take a prescription medicine daily to prevent HIV infection. This is called preexposure prophylaxis (PrEP). You are considered at risk if:  You are sexually active and do not regularly use condoms or know the HIV status of your partner(s).  You take drugs by injection.  You are sexually active with a partner who has HIV.  Talk with your health care provider about whether you are at high risk of being infected with HIV. If you choose to begin PrEP, you should first be tested for HIV. You should then be tested every 3 months for as long as you are taking PrEP.  Osteoporosis is a disease in which the bones lose minerals and strength with aging. This can result in serious bone fractures or breaks. The risk of osteoporosis can be identified using a bone density scan. Women ages 16 years and over and women at risk for fractures or osteoporosis should discuss screening with their health care providers. Ask your health care provider whether you should take a calcium supplement or vitamin D to reduce the rate of osteoporosis.  Menopause can be associated with physical symptoms and risks. Hormone  replacement therapy is available to decrease symptoms and risks. You should talk to your health care provider about whether hormone replacement therapy is right for you.  Use sunscreen. Apply sunscreen liberally and repeatedly throughout the day. You should seek shade when your shadow is shorter than you. Protect yourself by wearing long sleeves, pants, a wide-brimmed hat, and sunglasses year round, whenever you are outdoors.  Once a month, do a whole body skin exam, using a mirror to look at the skin on your back. Tell your health care provider of new moles, moles that have irregular borders, moles that are larger than a pencil eraser, or moles that have changed in shape or color.  Stay current with required vaccines (immunizations).  Influenza vaccine. All adults should be immunized every year.  Tetanus, diphtheria, and acellular pertussis (Td, Tdap) vaccine. Pregnant women should receive 1 dose of Tdap vaccine during each pregnancy. The dose should be obtained regardless of the length of time since the last dose. Immunization is preferred during the 27th-36th week of  gestation. An adult who has not previously received Tdap or who does not know her vaccine status should receive 1 dose of Tdap. This initial dose should be followed by tetanus and diphtheria toxoids (Td) booster doses every 10 years. Adults with an unknown or incomplete history of completing a 3-dose immunization series with Td-containing vaccines should begin or complete a primary immunization series including a Tdap dose. Adults should receive a Td booster every 10 years.  Varicella vaccine. An adult without evidence of immunity to varicella should receive 2 doses or a second dose if she has previously received 1 dose. Pregnant females who do not have evidence of immunity should receive the first dose after pregnancy. This first dose should be obtained before leaving the health care facility. The second dose should be obtained 4-8 weeks  after the first dose.  Human papillomavirus (HPV) vaccine. Females aged 13-26 years who have not received the vaccine previously should obtain the 3-dose series. The vaccine is not recommended for use in pregnant females. However, pregnancy testing is not needed before receiving a dose. If a female is found to be pregnant after receiving a dose, no treatment is needed. In that case, the remaining doses should be delayed until after the pregnancy. Immunization is recommended for any person with an immunocompromised condition through the age of 7 years if she did not get any or all doses earlier. During the 3-dose series, the second dose should be obtained 4-8 weeks after the first dose. The third dose should be obtained 24 weeks after the first dose and 16 weeks after the second dose.  Zoster vaccine. One dose is recommended for adults aged 15 years or older unless certain conditions are present.  Measles, mumps, and rubella (MMR) vaccine. Adults born before [birth year] generally are considered immune to measles and mumps. Adults born in [birth year] or later should have 1 or more doses of MMR vaccine unless there is a contraindication to the vaccine or there is laboratory evidence of immunity to each of the three diseases. A routine second dose of MMR vaccine should be obtained at least 28 days after the first dose for students attending postsecondary schools, health care workers, or international travelers. People who received inactivated measles vaccine or an unknown type of measles vaccine during 1963-1967 should receive 2 doses of MMR vaccine. People who received inactivated mumps vaccine or an unknown type of mumps vaccine before 1979 and are at high risk for mumps infection should consider immunization with 2 doses of MMR vaccine. For females of childbearing age, rubella immunity should be determined. If there is no evidence of immunity, females who are not pregnant should be vaccinated. If there is no evidence of  immunity, females who are pregnant should delay immunization until after pregnancy. Unvaccinated health care workers born before [birth year] who lack laboratory evidence of measles, mumps, or rubella immunity or laboratory confirmation of disease should consider measles and mumps immunization with 2 doses of MMR vaccine or rubella immunization with 1 dose of MMR vaccine.  Pneumococcal 13-valent conjugate (PCV13) vaccine. When indicated, a person who is uncertain of his immunization history and has no record of immunization should receive the PCV13 vaccine. All adults 42 years of age and older should receive this vaccine. An adult aged 3 years or older who has certain medical conditions and has not been previously immunized should receive 1 dose of PCV13 vaccine. This PCV13 should be followed with a dose of pneumococcal polysaccharide (PPSV23) vaccine. Adults who are at  high risk for pneumococcal disease should obtain the PPSV23 vaccine at least 8 weeks after the dose of PCV13 vaccine. Adults older than 65 years of age who have normal immune system function should obtain the PPSV23 vaccine dose at least 1 year after the dose of PCV13 vaccine.  Pneumococcal polysaccharide (PPSV23) vaccine. When PCV13 is also indicated, PCV13 should be obtained first. All adults aged 82 years and older should be immunized. An adult younger than age 15 years who has certain medical conditions should be immunized. Any person who resides in a nursing home or long-term care facility should be immunized. An adult smoker should be immunized. People with an immunocompromised condition and certain other conditions should receive both PCV13 and PPSV23 vaccines. People with human immunodeficiency virus (HIV) infection should be immunized as soon as possible after diagnosis. Immunization during chemotherapy or radiation therapy should be avoided. Routine use of PPSV23 vaccine is not recommended for American Indians, [place] Natives, or people  younger than 65 years unless there are medical conditions that require PPSV23 vaccine. When indicated, people who have unknown immunization and have no record of immunization should receive PPSV23 vaccine. One-time revaccination 5 years after the first dose of PPSV23 is recommended for people aged 19-64 years who have chronic kidney failure, nephrotic syndrome, asplenia, or immunocompromised conditions. People who received 1-2 doses of PPSV23 before age 58 years should receive another dose of PPSV23 vaccine at age 41 years or later if at least 5 years have passed since the previous dose. Doses of PPSV23 are not needed for people immunized with PPSV23 at or after age 46 years.  Meningococcal vaccine. Adults with asplenia or persistent complement component deficiencies should receive 2 doses of quadrivalent meningococcal conjugate (MenACWY-D) vaccine. The doses should be obtained at least 2 months apart. Microbiologists working with certain meningococcal bacteria, [place] recruits, people at risk during an outbreak, and people who travel to or live in countries with a high rate of meningitis should be immunized. A first-year college student up through age 5 years who is living in a residence hall should receive a dose if she did not receive a dose on or after her 16th birthday. Adults who have certain high-risk conditions should receive one or more doses of vaccine.  Hepatitis A vaccine. Adults who wish to be protected from this disease, have certain high-risk conditions, work with hepatitis A-infected animals, work in hepatitis A research labs, or travel to or work in countries with a high rate of hepatitis A should be immunized. Adults who were previously unvaccinated and who anticipate close contact with an international adoptee during the first 60 days after arrival in the [place] from a country with a high rate of hepatitis A should be immunized.  Hepatitis B vaccine. Adults who wish to be protected  from this disease, have certain high-risk conditions, may be exposed to blood or other infectious body fluids, are household contacts or sex partners of hepatitis B positive people, are clients or workers in certain care facilities, or travel to or work in countries with a high rate of hepatitis B should be immunized.  Haemophilus influenzae type b (Hib) vaccine. A previously unvaccinated person with asplenia or sickle cell disease or having a scheduled splenectomy should receive 1 dose of Hib vaccine. Regardless of previous immunization, a recipient of a hematopoietic stem cell transplant should receive a 3-dose series 6-12 months after her successful transplant. Hib vaccine is not recommended for adults with HIV infection. Preventive Services / Frequency  Ages 28 to 69 years  Blood pressure check.** / Every 3-5 years.  Lipid and cholesterol check.** / Every 5 years beginning at age 55.  Clinical breast exam.** / Every 3 years for women in their 94s and 40s.  BRCA-related cancer risk assessment.** / For women who have family members with a BRCA-related cancer (breast, ovarian, tubal, or peritoneal cancers).  Pap test.** / Every 2 years from ages 8 through 80. Every 3 years starting at age 24 through age 30 or 29 with a history of 3 consecutive normal Pap tests.  HPV screening.** / Every 3 years from ages 93 through ages 40 to 20 with a history of 3 consecutive normal Pap tests.  Hepatitis C blood test.** / For any individual with known risks for hepatitis C.  Skin self-exam. / Monthly.  Influenza vaccine. / Every year.  Tetanus, diphtheria, and acellular pertussis (Tdap, Td) vaccine.** / Consult your health care provider. Pregnant women should receive 1 dose of Tdap vaccine during each pregnancy. 1 dose of Td every 10 years.  Varicella vaccine.** / Consult your health care provider. Pregnant females who do not have evidence of immunity should receive the first dose after pregnancy.  HPV  vaccine. / 3 doses over 6 months, if 85 and younger. The vaccine is not recommended for use in pregnant females. However, pregnancy testing is not needed before receiving a dose.  Measles, mumps, rubella (MMR) vaccine.** / You need at least 1 dose of MMR if you were born in 1957 or later. You may also need a 2nd dose. For females of childbearing age, rubella immunity should be determined. If there is no evidence of immunity, females who are not pregnant should be vaccinated. If there is no evidence of immunity, females who are pregnant should delay immunization until after pregnancy.  Pneumococcal 13-valent conjugate (PCV13) vaccine.** / Consult your health care provider.  Pneumococcal polysaccharide (PPSV23) vaccine.** / 1 to 2 doses if you smoke cigarettes or if you have certain conditions.  Meningococcal vaccine.** / 1 dose if you are age 57 to 98 years and a Market researcher living in a residence hall, or have one of several medical conditions, you need to get vaccinated against meningococcal disease. You may also need additional booster doses.  Hepatitis A vaccine.** / Consult your health care provider.  Hepatitis B vaccine.** / Consult your health care provider.  Haemophilus influenzae type b (Hib) vaccine.** / Consult your health care provider. Ages 22 to 29 years  Blood pressure check.** / Every year.  Lipid and cholesterol check.** / Every 5 years beginning at age 63 years.  Lung cancer screening. / Every year if you are aged 66-80 years and have a 30-pack-year history of smoking and currently smoke or have quit within the past 15 years. Yearly screening is stopped once you have quit smoking for at least 15 years or develop a health problem that would prevent you from having lung cancer treatment.  Clinical breast exam.** / Every year after age 79 years.  BRCA-related cancer risk assessment.** / For women who have family members with a BRCA-related cancer (breast,  ovarian, tubal, or peritoneal cancers).  Mammogram.** / Every year beginning at age 41 years and continuing for as long as you are in good health. Consult with your health care provider.  Pap test.** / Every 3 years starting at age 1 years through age 66 or 22 years with a history of 3 consecutive normal Pap tests.  HPV screening.** / Every  3 years from ages 79 years through ages 37 to 40 years with a history of 3 consecutive normal Pap tests.  Fecal occult blood test (FOBT) of stool. / Every year beginning at age 1 years and continuing until age 63 years. You may not need to do this test if you get a colonoscopy every 10 years.  Flexible sigmoidoscopy or colonoscopy.** / Every 5 years for a flexible sigmoidoscopy or every 10 years for a colonoscopy beginning at age 4 years and continuing until age 53 years.  Hepatitis C blood test.** / For all people born from [birth year] through 1965 and any individual with known risks for hepatitis C.  Skin self-exam. / Monthly.  Influenza vaccine. / Every year.  Tetanus, diphtheria, and acellular pertussis (Tdap/Td) vaccine.** / Consult your health care provider. Pregnant women should receive 1 dose of Tdap vaccine during each pregnancy. 1 dose of Td every 10 years.  Varicella vaccine.** / Consult your health care provider. Pregnant females who do not have evidence of immunity should receive the first dose after pregnancy.  Zoster vaccine.** / 1 dose for adults aged 33 years or older.  Measles, mumps, rubella (MMR) vaccine.** / You need at least 1 dose of MMR if you were born in 1957 or later. You may also need a second dose. For females of childbearing age, rubella immunity should be determined. If there is no evidence of immunity, females who are not pregnant should be vaccinated. If there is no evidence of immunity, females who are pregnant should delay immunization until after pregnancy.  Pneumococcal 13-valent conjugate (PCV13) vaccine.** / Consult  your health care provider.  Pneumococcal polysaccharide (PPSV23) vaccine.** / 1 to 2 doses if you smoke cigarettes or if you have certain conditions.  Meningococcal vaccine.** / Consult your health care provider.  Hepatitis A vaccine.** / Consult your health care provider.  Hepatitis B vaccine.** / Consult your health care provider.  Haemophilus influenzae type b (Hib) vaccine.** / Consult your health care provider. Ages 51 years and over  Blood pressure check.** / Every year.  Lipid and cholesterol check.** / Every 5 years beginning at age 54 years.  Lung cancer screening. / Every year if you are aged 53-80 years and have a 30-pack-year history of smoking and currently smoke or have quit within the past 15 years. Yearly screening is stopped once you have quit smoking for at least 15 years or develop a health problem that would prevent you from having lung cancer treatment.  Clinical breast exam.** / Every year after age 52 years.  BRCA-related cancer risk assessment.** / For women who have family members with a BRCA-related cancer (breast, ovarian, tubal, or peritoneal cancers).  Mammogram.** / Every year beginning at age 48 years and continuing for as long as you are in good health. Consult with your health care provider.  Pap test.** / Every 3 years starting at age 41 years through age 89 or 28 years with 3 consecutive normal Pap tests. Testing can be stopped between 65 and 70 years with 3 consecutive normal Pap tests and no abnormal Pap or HPV tests in the past 10 years.  HPV screening.** / Every 3 years from ages 59 years through ages 35 or 35 years with a history of 3 consecutive normal Pap tests. Testing can be stopped between 65 and 70 years with 3 consecutive normal Pap tests and no abnormal Pap or HPV tests in the past 10 years.  Fecal occult blood test (FOBT) of stool. /  Every year beginning at age 32 years and continuing until age 6 years. You may not need to do this test  if you get a colonoscopy every 10 years.  Flexible sigmoidoscopy or colonoscopy.** / Every 5 years for a flexible sigmoidoscopy or every 10 years for a colonoscopy beginning at age 41 years and continuing until age 67 years.  Hepatitis C blood test.** / For all people born from [birth year] through 1965 and any individual with known risks for hepatitis C.  Osteoporosis screening.** / A one-time screening for women ages 33 years and over and women at risk for fractures or osteoporosis.  Skin self-exam. / Monthly.  Influenza vaccine. / Every year.  Tetanus, diphtheria, and acellular pertussis (Tdap/Td) vaccine.** / 1 dose of Td every 10 years.  Varicella vaccine.** / Consult your health care provider.  Zoster vaccine.** / 1 dose for adults aged 38 years or older.  Pneumococcal 13-valent conjugate (PCV13) vaccine.** / Consult your health care provider.  Pneumococcal polysaccharide (PPSV23) vaccine.** / 1 dose for all adults aged 28 years and older.  Meningococcal vaccine.** / Consult your health care provider.  Hepatitis A vaccine.** / Consult your health care provider.  Hepatitis B vaccine.** / Consult your health care provider.  Haemophilus influenzae type b (Hib) vaccine.** / Consult your health care provider. ** Family history and personal history of risk and conditions may change your health care provider's recommendations.   This information is not intended to replace advice given to you by your health care provider. Make sure you discuss any questions you have with your health care provider.   Document Released: 11/17/2001 Document Revised: 10/12/2014 Document Reviewed: 02/16/2011 Elsevier Interactive Patient Education [employer].    An after visit summary with all of these plans was given to the patient.

## 2016-08-11 NOTE — Patient Instructions (Addendum)
IF you received an x-ray today, you will receive an invoice from [facility]. Please contact [facility] at [phone] with questions or concerns regarding your invoice.   IF you received labwork today, you will receive an invoice from [employer]. Please contact [facility] at [phone] with questions or concerns regarding your invoice.   Our billing staff will not be able to assist you with questions regarding bills from these companies.  You will be contacted with the lab results as soon as they are available. The fastest way to get your results is to activate your My Chart account. Instructions are located on the last page of this paperwork. If you have not heard from [place] regarding the results in 2 weeks, please contact this office.     Preventive Care for Adults, Female A healthy lifestyle and preventive care can promote health and wellness. Preventive health guidelines for women include the following key practices.  A routine yearly physical is a good way to check with your health care provider about your health and preventive screening. It is a chance to share any concerns and updates on your health and to receive a thorough exam.  Visit your dentist for a routine exam and preventive care every 6 months. Brush your teeth twice a day and floss once a day. Good oral hygiene prevents tooth decay and gum disease.  The frequency of eye exams is based on your age, health, family medical history, use of contact lenses, and other factors. Follow your health care provider's recommendations for frequency of eye exams.  Eat a healthy diet. Foods like vegetables, fruits, whole grains, low-fat dairy products, and lean protein foods contain the nutrients you need without too many calories. Decrease your intake of foods high in solid fats, added sugars, and salt. Eat the right amount of calories for you.Get information about a proper diet from your  health care provider, if necessary.  Regular physical exercise is one of the most important things you can do for your health. Most adults should get at least 150 minutes of moderate-intensity exercise (any activity that increases your heart rate and causes you to sweat) each week. In addition, most adults need muscle-strengthening exercises on 2 or more days a week.  Maintain a healthy weight. The body mass index (BMI) is a screening tool to identify possible weight problems. It provides an estimate of body fat based on height and weight. Your health care provider can find your BMI and can help you achieve or maintain a healthy weight.For adults 20 years and older:  A BMI below 18.5 is considered underweight.  A BMI of 18.5 to 24.9 is normal.  A BMI of 25 to 29.9 is considered overweight.  A BMI of 30 and above is considered obese.  Maintain normal blood lipids and cholesterol levels by exercising and minimizing your intake of saturated fat. Eat a balanced diet with plenty of fruit and vegetables. Blood tests for lipids and cholesterol should begin at age 39 and be repeated every 5 years. If your lipid or cholesterol levels are high, you are over 50, or you are at high risk for heart disease, you may need your cholesterol levels checked more frequently.Ongoing high lipid and cholesterol levels should be treated with medicines if diet and exercise are not working.  If you smoke, find out from your health care provider how to quit. If you do not use tobacco, do not start.  Lung cancer screening is recommended  for adults aged 48-80 years who are at high risk for developing lung cancer because of a history of smoking. A yearly low-dose CT scan of the lungs is recommended for people who have at least a 30-pack-year history of smoking and are a current smoker or have quit within the past 15 years. A pack year of smoking is smoking an average of 1 pack of cigarettes a day for 1 year (for example: 1  pack a day for 30 years or 2 packs a day for 15 years). Yearly screening should continue until the smoker has stopped smoking for at least 15 years. Yearly screening should be stopped for people who develop a health problem that would prevent them from having lung cancer treatment.  If you are pregnant, do not drink alcohol. If you are breastfeeding, be very cautious about drinking alcohol. If you are not pregnant and choose to drink alcohol, do not have more than 1 drink per day. One drink is considered to be 12 ounces (355 mL) of beer, 5 ounces (148 mL) of wine, or 1.5 ounces (44 mL) of liquor.  Avoid use of street drugs. Do not share needles with anyone. Ask for help if you need support or instructions about stopping the use of drugs.  High blood pressure causes heart disease and increases the risk of stroke. Your blood pressure should be checked at least every 1 to 2 years. Ongoing high blood pressure should be treated with medicines if weight loss and exercise do not work.  If you are 7-4 years old, ask your health care provider if you should take aspirin to prevent strokes.  Diabetes screening is done by taking a blood sample to check your blood glucose level after you have not eaten for a certain period of time (fasting). If you are not overweight and you do not have risk factors for diabetes, you should be screened once every 3 years starting at age 2. If you are overweight or obese and you are 3-48 years of age, you should be screened for diabetes every year as part of your cardiovascular risk assessment.  Breast cancer screening is essential preventive care for women. You should practice "breast self-awareness." This means understanding the normal appearance and feel of your breasts and may include breast self-examination. Any changes detected, no matter how small, should be reported to a health care provider. Women in their 50s and 30s should have a clinical breast exam (CBE) by a health  care provider as part of a regular health exam every 1 to 3 years. After age 67, women should have a CBE every year. Starting at age 30, women should consider having a mammogram (breast X-ray test) every year. Women who have a family history of breast cancer should talk to their health care provider about genetic screening. Women at a high risk of breast cancer should talk to their health care providers about having an MRI and a mammogram every year.  Breast cancer gene (BRCA)-related cancer risk assessment is recommended for women who have family members with BRCA-related cancers. BRCA-related cancers include breast, ovarian, tubal, and peritoneal cancers. Having family members with these cancers may be associated with an increased risk for harmful changes (mutations) in the breast cancer genes BRCA1 and BRCA2. Results of the assessment will determine the need for genetic counseling and BRCA1 and BRCA2 testing.  Your health care provider may recommend that you be screened regularly for cancer of the pelvic organs (ovaries, uterus, and vagina). This screening  involves a pelvic examination, including checking for microscopic changes to the surface of your cervix (Pap test). You may be encouraged to have this screening done every 3 years, beginning at age 22.  For women ages 15-65, health care providers may recommend pelvic exams and Pap testing every 3 years, or they may recommend the Pap and pelvic exam, combined with testing for human papilloma virus (HPV), every 5 years. Some types of HPV increase your risk of cervical cancer. Testing for HPV may also be done on women of any age with unclear Pap test results.  Other health care providers may not recommend any screening for nonpregnant women who are considered low risk for pelvic cancer and who do not have symptoms. Ask your health care provider if a screening pelvic exam is right for you.  If you have had past treatment for cervical cancer or a condition  that could lead to cancer, you need Pap tests and screening for cancer for at least 20 years after your treatment. If Pap tests have been discontinued, your risk factors (such as having a new sexual partner) need to be reassessed to determine if screening should resume. Some women have medical problems that increase the chance of getting cervical cancer. In these cases, your health care provider may recommend more frequent screening and Pap tests.  Colorectal cancer can be detected and often prevented. Most routine colorectal cancer screening begins at the age of 69 years and continues through age 86 years. However, your health care provider may recommend screening at an earlier age if you have risk factors for colon cancer. On a yearly basis, your health care provider may provide home test kits to check for hidden blood in the stool. Use of a small camera at the end of a tube, to directly examine the colon (sigmoidoscopy or colonoscopy), can detect the earliest forms of colorectal cancer. Talk to your health care provider about this at age 2, when routine screening begins. Direct exam of the colon should be repeated every 5-10 years through age 16 years, unless early forms of precancerous polyps or small growths are found.  People who are at an increased risk for hepatitis B should be screened for this virus. You are considered at high risk for hepatitis B if:  You were born in a country where hepatitis B occurs often. Talk with your health care provider about which countries are considered high risk.  Your parents were born in a high-risk country and you have not received a shot to protect against hepatitis B (hepatitis B vaccine).  You have HIV or AIDS.  You use needles to inject street drugs.  You live with, or have sex with, someone who has hepatitis B.  You get hemodialysis treatment.  You take certain medicines for conditions like cancer, organ transplantation, and autoimmune  conditions.  Hepatitis C blood testing is recommended for all people born from [birth year] through 1965 and any individual with known risks for hepatitis C.  Practice safe sex. Use condoms and avoid high-risk sexual practices to reduce the spread of sexually transmitted infections (STIs). STIs include gonorrhea, chlamydia, syphilis, trichomonas, herpes, HPV, and human immunodeficiency virus (HIV). Herpes, HIV, and HPV are viral illnesses that have no cure. They can result in disability, cancer, and death.  You should be screened for sexually transmitted illnesses (STIs) including gonorrhea and chlamydia if:  You are sexually active and are younger than 24 years.  You are older than 24 years and your health care  provider tells you that you are at risk for this type of infection.  Your sexual activity has changed since you were last screened and you are at an increased risk for chlamydia or gonorrhea. Ask your health care provider if you are at risk.  If you are at risk of being infected with HIV, it is recommended that you take a prescription medicine daily to prevent HIV infection. This is called preexposure prophylaxis (PrEP). You are considered at risk if:  You are sexually active and do not regularly use condoms or know the HIV status of your partner(s).  You take drugs by injection.  You are sexually active with a partner who has HIV.  Talk with your health care provider about whether you are at high risk of being infected with HIV. If you choose to begin PrEP, you should first be tested for HIV. You should then be tested every 3 months for as long as you are taking PrEP.  Osteoporosis is a disease in which the bones lose minerals and strength with aging. This can result in serious bone fractures or breaks. The risk of osteoporosis can be identified using a bone density scan. Women ages 11 years and over and women at risk for fractures or osteoporosis should discuss screening with their health  care providers. Ask your health care provider whether you should take a calcium supplement or vitamin D to reduce the rate of osteoporosis.  Menopause can be associated with physical symptoms and risks. Hormone replacement therapy is available to decrease symptoms and risks. You should talk to your health care provider about whether hormone replacement therapy is right for you.  Use sunscreen. Apply sunscreen liberally and repeatedly throughout the day. You should seek shade when your shadow is shorter than you. Protect yourself by wearing long sleeves, pants, a wide-brimmed hat, and sunglasses year round, whenever you are outdoors.  Once a month, do a whole body skin exam, using a mirror to look at the skin on your back. Tell your health care provider of new moles, moles that have irregular borders, moles that are larger than a pencil eraser, or moles that have changed in shape or color.  Stay current with required vaccines (immunizations).  Influenza vaccine. All adults should be immunized every year.  Tetanus, diphtheria, and acellular pertussis (Td, Tdap) vaccine. Pregnant women should receive 1 dose of Tdap vaccine during each pregnancy. The dose should be obtained regardless of the length of time since the last dose. Immunization is preferred during the 27th-36th week of gestation. An adult who has not previously received Tdap or who does not know her vaccine status should receive 1 dose of Tdap. This initial dose should be followed by tetanus and diphtheria toxoids (Td) booster doses every 10 years. Adults with an unknown or incomplete history of completing a 3-dose immunization series with Td-containing vaccines should begin or complete a primary immunization series including a Tdap dose. Adults should receive a Td booster every 10 years.  Varicella vaccine. An adult without evidence of immunity to varicella should receive 2 doses or a second dose if she has previously received 1 dose. Pregnant  females who do not have evidence of immunity should receive the first dose after pregnancy. This first dose should be obtained before leaving the health care facility. The second dose should be obtained 4-8 weeks after the first dose.  Human papillomavirus (HPV) vaccine. Females aged 13-26 years who have not received the vaccine previously should obtain the 3-dose series.  The vaccine is not recommended for use in pregnant females. However, pregnancy testing is not needed before receiving a dose. If a female is found to be pregnant after receiving a dose, no treatment is needed. In that case, the remaining doses should be delayed until after the pregnancy. Immunization is recommended for any person with an immunocompromised condition through the age of 74 years if she did not get any or all doses earlier. During the 3-dose series, the second dose should be obtained 4-8 weeks after the first dose. The third dose should be obtained 24 weeks after the first dose and 16 weeks after the second dose.  Zoster vaccine. One dose is recommended for adults aged 52 years or older unless certain conditions are present.  Measles, mumps, and rubella (MMR) vaccine. Adults born before [birth year] generally are considered immune to measles and mumps. Adults born in [birth year] or later should have 1 or more doses of MMR vaccine unless there is a contraindication to the vaccine or there is laboratory evidence of immunity to each of the three diseases. A routine second dose of MMR vaccine should be obtained at least 28 days after the first dose for students attending postsecondary schools, health care workers, or international travelers. People who received inactivated measles vaccine or an unknown type of measles vaccine during 1963-1967 should receive 2 doses of MMR vaccine. People who received inactivated mumps vaccine or an unknown type of mumps vaccine before 1979 and are at high risk for mumps infection should consider immunization with 2  doses of MMR vaccine. For females of childbearing age, rubella immunity should be determined. If there is no evidence of immunity, females who are not pregnant should be vaccinated. If there is no evidence of immunity, females who are pregnant should delay immunization until after pregnancy. Unvaccinated health care workers born before [birth year] who lack laboratory evidence of measles, mumps, or rubella immunity or laboratory confirmation of disease should consider measles and mumps immunization with 2 doses of MMR vaccine or rubella immunization with 1 dose of MMR vaccine.  Pneumococcal 13-valent conjugate (PCV13) vaccine. When indicated, a person who is uncertain of his immunization history and has no record of immunization should receive the PCV13 vaccine. All adults 45 years of age and older should receive this vaccine. An adult aged 76 years or older who has certain medical conditions and has not been previously immunized should receive 1 dose of PCV13 vaccine. This PCV13 should be followed with a dose of pneumococcal polysaccharide (PPSV23) vaccine. Adults who are at high risk for pneumococcal disease should obtain the PPSV23 vaccine at least 8 weeks after the dose of PCV13 vaccine. Adults older than 65 years of age who have normal immune system function should obtain the PPSV23 vaccine dose at least 1 year after the dose of PCV13 vaccine.  Pneumococcal polysaccharide (PPSV23) vaccine. When PCV13 is also indicated, PCV13 should be obtained first. All adults aged 16 years and older should be immunized. An adult younger than age 48 years who has certain medical conditions should be immunized. Any person who resides in a nursing home or long-term care facility should be immunized. An adult smoker should be immunized. People with an immunocompromised condition and certain other conditions should receive both PCV13 and PPSV23 vaccines. People with human immunodeficiency virus (HIV) infection should be immunized as  soon as possible after diagnosis. Immunization during chemotherapy or radiation therapy should be avoided. Routine use of PPSV23 vaccine is not recommended for American Indians, [place] Natives,  or people younger than 65 years unless there are medical conditions that require PPSV23 vaccine. When indicated, people who have unknown immunization and have no record of immunization should receive PPSV23 vaccine. One-time revaccination 5 years after the first dose of PPSV23 is recommended for people aged 19-64 years who have chronic kidney failure, nephrotic syndrome, asplenia, or immunocompromised conditions. People who received 1-2 doses of PPSV23 before age 23 years should receive another dose of PPSV23 vaccine at age 56 years or later if at least 5 years have passed since the previous dose. Doses of PPSV23 are not needed for people immunized with PPSV23 at or after age 65 years.  Meningococcal vaccine. Adults with asplenia or persistent complement component deficiencies should receive 2 doses of quadrivalent meningococcal conjugate (MenACWY-D) vaccine. The doses should be obtained at least 2 months apart. Microbiologists working with certain meningococcal bacteria, [employer] recruits, people at risk during an outbreak, and people who travel to or live in countries with a high rate of meningitis should be immunized. A first-year college student up through age 69 years who is living in a residence hall should receive a dose if she did not receive a dose on or after her 16th birthday. Adults who have certain high-risk conditions should receive one or more doses of vaccine.  Hepatitis A vaccine. Adults who wish to be protected from this disease, have certain high-risk conditions, work with hepatitis A-infected animals, work in hepatitis A research labs, or travel to or work in countries with a high rate of hepatitis A should be immunized. Adults who were previously unvaccinated and who anticipate close contact with an  international adoptee during the first 60 days after arrival in the [place] from a country with a high rate of hepatitis A should be immunized.  Hepatitis B vaccine. Adults who wish to be protected from this disease, have certain high-risk conditions, may be exposed to blood or other infectious body fluids, are household contacts or sex partners of hepatitis B positive people, are clients or workers in certain care facilities, or travel to or work in countries with a high rate of hepatitis B should be immunized.  Haemophilus influenzae type b (Hib) vaccine. A previously unvaccinated person with asplenia or sickle cell disease or having a scheduled splenectomy should receive 1 dose of Hib vaccine. Regardless of previous immunization, a recipient of a hematopoietic stem cell transplant should receive a 3-dose series 6-12 months after her successful transplant. Hib vaccine is not recommended for adults with HIV infection. Preventive Services / Frequency Ages 44 to 62 years  Blood pressure check.** / Every 3-5 years.  Lipid and cholesterol check.** / Every 5 years beginning at age 64.  Clinical breast exam.** / Every 3 years for women in their 72s and 57s.  BRCA-related cancer risk assessment.** / For women who have family members with a BRCA-related cancer (breast, ovarian, tubal, or peritoneal cancers).  Pap test.** / Every 2 years from ages 53 through 4. Every 3 years starting at age 69 through age 56 or 73 with a history of 3 consecutive normal Pap tests.  HPV screening.** / Every 3 years from ages 71 through ages 66 to 85 with a history of 3 consecutive normal Pap tests.  Hepatitis C blood test.** / For any individual with known risks for hepatitis C.  Skin self-exam. / Monthly.  Influenza vaccine. / Every year.  Tetanus, diphtheria, and acellular pertussis (Tdap, Td) vaccine.** / Consult your health care provider. Pregnant women should receive  1 dose of Tdap vaccine during each  pregnancy. 1 dose of Td every 10 years.  Varicella vaccine.** / Consult your health care provider. Pregnant females who do not have evidence of immunity should receive the first dose after pregnancy.  HPV vaccine. / 3 doses over 6 months, if 34 and younger. The vaccine is not recommended for use in pregnant females. However, pregnancy testing is not needed before receiving a dose.  Measles, mumps, rubella (MMR) vaccine.** / You need at least 1 dose of MMR if you were born in 1957 or later. You may also need a 2nd dose. For females of childbearing age, rubella immunity should be determined. If there is no evidence of immunity, females who are not pregnant should be vaccinated. If there is no evidence of immunity, females who are pregnant should delay immunization until after pregnancy.  Pneumococcal 13-valent conjugate (PCV13) vaccine.** / Consult your health care provider.  Pneumococcal polysaccharide (PPSV23) vaccine.** / 1 to 2 doses if you smoke cigarettes or if you have certain conditions.  Meningococcal vaccine.** / 1 dose if you are age 72 to 55 years and a Market researcher living in a residence hall, or have one of several medical conditions, you need to get vaccinated against meningococcal disease. You may also need additional booster doses.  Hepatitis A vaccine.** / Consult your health care provider.  Hepatitis B vaccine.** / Consult your health care provider.  Haemophilus influenzae type b (Hib) vaccine.** / Consult your health care provider. Ages 78 to 35 years  Blood pressure check.** / Every year.  Lipid and cholesterol check.** / Every 5 years beginning at age 97 years.  Lung cancer screening. / Every year if you are aged 11-80 years and have a 30-pack-year history of smoking and currently smoke or have quit within the past 15 years. Yearly screening is stopped once you have quit smoking for at least 15 years or develop a health problem that would prevent you from  having lung cancer treatment.  Clinical breast exam.** / Every year after age 77 years.  BRCA-related cancer risk assessment.** / For women who have family members with a BRCA-related cancer (breast, ovarian, tubal, or peritoneal cancers).  Mammogram.** / Every year beginning at age 28 years and continuing for as long as you are in good health. Consult with your health care provider.  Pap test.** / Every 3 years starting at age 41 years through age 58 or 79 years with a history of 3 consecutive normal Pap tests.  HPV screening.** / Every 3 years from ages 24 years through ages 30 to 33 years with a history of 3 consecutive normal Pap tests.  Fecal occult blood test (FOBT) of stool. / Every year beginning at age 42 years and continuing until age 25 years. You may not need to do this test if you get a colonoscopy every 10 years.  Flexible sigmoidoscopy or colonoscopy.** / Every 5 years for a flexible sigmoidoscopy or every 10 years for a colonoscopy beginning at age 80 years and continuing until age 31 years.  Hepatitis C blood test.** / For all people born from [birth year] through 1965 and any individual with known risks for hepatitis C.  Skin self-exam. / Monthly.  Influenza vaccine. / Every year.  Tetanus, diphtheria, and acellular pertussis (Tdap/Td) vaccine.** / Consult your health care provider. Pregnant women should receive 1 dose of Tdap vaccine during each pregnancy. 1 dose of Td every 10 years.  Varicella vaccine.** / Consult your health care  provider. Pregnant females who do not have evidence of immunity should receive the first dose after pregnancy.  Zoster vaccine.** / 1 dose for adults aged 76 years or older.  Measles, mumps, rubella (MMR) vaccine.** / You need at least 1 dose of MMR if you were born in 1957 or later. You may also need a second dose. For females of childbearing age, rubella immunity should be determined. If there is no evidence of immunity, females who are not  pregnant should be vaccinated. If there is no evidence of immunity, females who are pregnant should delay immunization until after pregnancy.  Pneumococcal 13-valent conjugate (PCV13) vaccine.** / Consult your health care provider.  Pneumococcal polysaccharide (PPSV23) vaccine.** / 1 to 2 doses if you smoke cigarettes or if you have certain conditions.  Meningococcal vaccine.** / Consult your health care provider.  Hepatitis A vaccine.** / Consult your health care provider.  Hepatitis B vaccine.** / Consult your health care provider.  Haemophilus influenzae type b (Hib) vaccine.** / Consult your health care provider. Ages 41 years and over  Blood pressure check.** / Every year.  Lipid and cholesterol check.** / Every 5 years beginning at age 71 years.  Lung cancer screening. / Every year if you are aged 26-80 years and have a 30-pack-year history of smoking and currently smoke or have quit within the past 15 years. Yearly screening is stopped once you have quit smoking for at least 15 years or develop a health problem that would prevent you from having lung cancer treatment.  Clinical breast exam.** / Every year after age 72 years.  BRCA-related cancer risk assessment.** / For women who have family members with a BRCA-related cancer (breast, ovarian, tubal, or peritoneal cancers).  Mammogram.** / Every year beginning at age 21 years and continuing for as long as you are in good health. Consult with your health care provider.  Pap test.** / Every 3 years starting at age 51 years through age 24 or 51 years with 3 consecutive normal Pap tests. Testing can be stopped between 65 and 70 years with 3 consecutive normal Pap tests and no abnormal Pap or HPV tests in the past 10 years.  HPV screening.** / Every 3 years from ages 91 years through ages 24 or 69 years with a history of 3 consecutive normal Pap tests. Testing can be stopped between 65 and 70 years with 3 consecutive normal Pap tests  and no abnormal Pap or HPV tests in the past 10 years.  Fecal occult blood test (FOBT) of stool. / Every year beginning at age 43 years and continuing until age 58 years. You may not need to do this test if you get a colonoscopy every 10 years.  Flexible sigmoidoscopy or colonoscopy.** / Every 5 years for a flexible sigmoidoscopy or every 10 years for a colonoscopy beginning at age 62 years and continuing until age 14 years.  Hepatitis C blood test.** / For all people born from [birth year] through 1965 and any individual with known risks for hepatitis C.  Osteoporosis screening.** / A one-time screening for women ages 16 years and over and women at risk for fractures or osteoporosis.  Skin self-exam. / Monthly.  Influenza vaccine. / Every year.  Tetanus, diphtheria, and acellular pertussis (Tdap/Td) vaccine.** / 1 dose of Td every 10 years.  Varicella vaccine.** / Consult your health care provider.  Zoster vaccine.** / 1 dose for adults aged 38 years or older.  Pneumococcal 13-valent conjugate (PCV13) vaccine.** / Consult your health  care provider.  Pneumococcal polysaccharide (PPSV23) vaccine.** / 1 dose for all adults aged 17 years and older.  Meningococcal vaccine.** / Consult your health care provider.  Hepatitis A vaccine.** / Consult your health care provider.  Hepatitis B vaccine.** / Consult your health care provider.  Haemophilus influenzae type b (Hib) vaccine.** / Consult your health care provider. ** Family history and personal history of risk and conditions may change your health care provider's recommendations.   This information is not intended to replace advice given to you by your health care provider. Make sure you discuss any questions you have with your health care provider.   Document Released: 11/17/2001 Document Revised: 10/12/2014 Document Reviewed: 02/16/2011 Elsevier Interactive Patient Education [employer].

## 2016-08-12 ENCOUNTER — Encounter: Payer: Self-pay | Admitting: Family Medicine

## 2016-08-12 LAB — CBC
HEMATOCRIT: 38.4 % (ref 35.0–45.0)
HEMOGLOBIN: 13.2 g/dL (ref 11.7–15.5)
MCH: 32 pg (ref 27.0–33.0)
MCHC: 34.4 g/dL (ref 32.0–36.0)
MCV: 93 fL (ref 80.0–100.0)
MPV: 9.9 fL (ref 7.5–12.5)
Platelets: 229 10*3/uL (ref 140–400)
RBC: 4.13 MIL/uL (ref 3.80–5.10)
RDW: 13.3 % (ref 11.0–15.0)
WBC: 4.4 10*3/uL (ref 3.8–10.8)

## 2016-08-12 LAB — COMPREHENSIVE METABOLIC PANEL
ALK PHOS: 48 U/L (ref 33–130)
ALT: 18 U/L (ref 6–29)
AST: 24 U/L (ref 10–35)
Albumin: 4.5 g/dL (ref 3.6–5.1)
BUN: 17 mg/dL (ref 7–25)
CALCIUM: 9.4 mg/dL (ref 8.6–10.4)
CO2: 28 mmol/L (ref 20–31)
Chloride: 105 mmol/L (ref 98–110)
Creat: 0.92 mg/dL (ref 0.50–0.99)
Glucose, Bld: 85 mg/dL (ref 65–99)
POTASSIUM: 4.4 mmol/L (ref 3.5–5.3)
Sodium: 139 mmol/L (ref 135–146)
TOTAL PROTEIN: 6.6 g/dL (ref 6.1–8.1)
Total Bilirubin: 0.4 mg/dL (ref 0.2–1.2)

## 2016-08-12 LAB — LIPID PANEL
CHOL/HDL RATIO: 1.7 ratio (ref <5.0)
CHOLESTEROL: 201 mg/dL — AB (ref <200)
HDL: 119 mg/dL (ref >50)
LDL Cholesterol: 72 mg/dL
Triglycerides: 52 mg/dL (ref <150)
VLDL: 10 mg/dL (ref <30)

## 2016-09-01 ENCOUNTER — Ambulatory Visit (INDEPENDENT_AMBULATORY_CARE_PROVIDER_SITE_OTHER): Payer: Medicare Other | Admitting: Physician Assistant

## 2016-09-01 VITALS — BP 106/70 | HR 81 | Temp 97.9°F | Resp 16 | Ht 68.0 in | Wt 149.0 lb

## 2016-09-01 DIAGNOSIS — Z01818 Encounter for other preprocedural examination: Secondary | ICD-10-CM | POA: Diagnosis not present

## 2016-09-01 NOTE — Patient Instructions (Signed)
     IF you received an x-ray today, you will receive an invoice from [facility]. Please contact [facility] at [phone] with questions or concerns regarding your invoice.   IF you received labwork today, you will receive an invoice from [facility]. Please contact [facility] at [phone] with questions or concerns regarding your invoice.   Our billing staff will not be able to assist you with questions regarding bills from these companies.  You will be contacted with the lab results as soon as they are available. The fastest way to get your results is to activate your My Chart account. Instructions are located on the last page of this paperwork. If you have not heard from us regarding the results in 2 weeks, please contact this office.      

## 2016-09-01 NOTE — Progress Notes (Deleted)
   Patient ID: Rebecca [name], female    DOB: 03/11/1951, 65 y.o.   MRN: [id number]  PCP: No PCP Per Patient  Chief Complaint  Patient presents with  . Other    pt wants labwork - ptt    Subjective:   HPI Presents for evaluation of ***.  **    Review of Systems     Patient Active Problem List   Diagnosis Date Noted  . Lichen planus [id number]  . Anxiety disorder 11/05/2011  . Menopause   . Murmur   . Poikiloderma   . Herpes zoster   . Degenerative disk disease   . Tinnitus   . Colon polyp      Prior to Admission medications   Medication Sig Start Date End Date Taking? Authorizing Provider  azelastine (OPTIVAR) 0.05 % ophthalmic solution Place 1 drop into both eyes 2 (two) times daily. 06/05/16  Yes Jaynee Eagles, PA-C  cetirizine (ZYRTEC) 10 MG tablet Take 1 tablet (10 mg total) by mouth daily. 02/13/15  Yes Tereasa Coop, PA-C  hydroxychloroquine (PLAQUENIL) 200 MG tablet Take by mouth 2 (two) times daily.   Yes Historical Provider, MD  lamoTRIgine (LAMICTAL) 150 MG tablet Take 150 mg by mouth daily.   Yes Historical Provider, MD  venlafaxine (EFFEXOR) 37.5 MG tablet Take 37.5 mg by mouth once.   Yes Historical Provider, MD     Allergies  Allergen Reactions  . Codeine Hives       Objective:  Physical Exam         Assessment & Plan:   ***

## 2016-09-01 NOTE — Progress Notes (Signed)
   Rebecca [name]  MRN: [id number] DOB: 13-May-1951  Subjective:  Pt presents to clinic for a PTT lab test- Jan 3rd she has surgery scheduled at [facility] for eye lift and her surgeon wants to have this done prior.  Review of Systems  Patient Active Problem List   Diagnosis Date Noted  . Lichen planus [id number]  . Anxiety disorder 11/05/2011  . Menopause   . Murmur   . Poikiloderma   . Herpes zoster   . Degenerative disk disease   . Tinnitus   . Colon polyp     Current Outpatient Prescriptions on File Prior to Visit  Medication Sig Dispense Refill  . azelastine (OPTIVAR) 0.05 % ophthalmic solution Place 1 drop into both eyes 2 (two) times daily. 6 mL 12  . cetirizine (ZYRTEC) 10 MG tablet Take 1 tablet (10 mg total) by mouth daily. 30 tablet 11  . hydroxychloroquine (PLAQUENIL) 200 MG tablet Take by mouth 2 (two) times daily.    Marland Kitchen lamoTRIgine (LAMICTAL) 150 MG tablet Take 150 mg by mouth daily.    Marland Kitchen venlafaxine (EFFEXOR) 37.5 MG tablet Take 37.5 mg by mouth once.     No current facility-administered medications on file prior to visit.     Allergies  Allergen Reactions  . Codeine Hives    Pt patients past, family and social history were reviewed and updated.   Objective:  BP 106/70 (BP Location: Right Arm, Patient Position: Sitting, Cuff Size: Normal)   Pulse 81   Temp 97.9 F (36.6 C)   Resp 16   Ht 5\' 8"  (1.727 m)   Wt 149 lb (67.6 kg)   SpO2 100%   BMI 22.66 kg/m   Physical Exam  Constitutional: She is oriented to person, place, and time and well-developed, well-nourished, and in no distress.  HENT:  Head: Normocephalic and atraumatic.  Right Ear: Hearing and external ear normal.  Left Ear: Hearing and external ear normal.  Eyes: Conjunctivae are normal.  Neck: Normal range of motion.  Pulmonary/Chest: Effort normal.  Neurological: She is alert and oriented to person, place, and time. Gait normal.  Skin: Skin is warm and dry.  Psychiatric: Mood, memory,  affect and judgment normal.  Vitals reviewed.  Assessment and Plan :  Pre-operative clearance - Plan: APTT   Pt will send me as message as the MD name and fax so I can get the information to them.  Windell Hummingbird PA-C  [facility] 09/01/2016 6:08 PM

## 2016-09-02 LAB — APTT: APTT: 29 s (ref 22–34)

## 2016-12-30 ENCOUNTER — Ambulatory Visit (INDEPENDENT_AMBULATORY_CARE_PROVIDER_SITE_OTHER): Payer: Medicare Other | Admitting: Physician Assistant

## 2016-12-30 VITALS — BP 134/85 | HR 96 | Temp 97.5°F | Resp 16 | Ht 68.0 in | Wt 154.0 lb

## 2016-12-30 DIAGNOSIS — S40812A Abrasion of left upper arm, initial encounter: Secondary | ICD-10-CM

## 2016-12-30 DIAGNOSIS — S8001XA Contusion of right knee, initial encounter: Secondary | ICD-10-CM

## 2016-12-30 DIAGNOSIS — W108XXA Fall (on) (from) other stairs and steps, initial encounter: Secondary | ICD-10-CM

## 2016-12-30 DIAGNOSIS — S0003XA Contusion of scalp, initial encounter: Secondary | ICD-10-CM | POA: Diagnosis not present

## 2016-12-30 DIAGNOSIS — S8011XA Contusion of right lower leg, initial encounter: Secondary | ICD-10-CM | POA: Diagnosis not present

## 2016-12-30 NOTE — Patient Instructions (Signed)
     IF you received an x-ray today, you will receive an invoice from [facility]. Please contact [facility] at [phone] with questions or concerns regarding your invoice.   IF you received labwork today, you will receive an invoice from [facility]. Please contact [facility] at [phone] with questions or concerns regarding your invoice.   Our billing staff will not be able to assist you with questions regarding bills from these companies.  You will be contacted with the lab results as soon as they are available. The fastest way to get your results is to activate your My Chart account. Instructions are located on the last page of this paperwork. If you have not heard from us regarding the results in 2 weeks, please contact this office.     

## 2016-12-30 NOTE — Progress Notes (Signed)
Rebecca [name]  MRN: [id number] DOB: 04/17/1951  PCP: No PCP Per Patient  Subjective:  Pt is a 66 year old female who presents to clinic for fall down stairs.  She was moving boxes this morning about 2.5 hours ago in a house. She was walking backwards towards the stairs, when she missed the first step down and fell backwards. She fell down about 11 steps on her back "rolled up in a ball". She hit the back of her head on the door frame at the bottom. C/o bump on head, headache, left arm pain, and right leg pain.  Denies LOC, nausea, vomiting, memory loss, behavior change, vision changes, decreased ROM, neck pain, increasing headache.  She is not on anticoagulation therapy. She does not take daily aspirin.   Review of Systems  Eyes: Negative for photophobia and visual disturbance.  Gastrointestinal: Negative for nausea and vomiting.  Musculoskeletal: Positive for arthralgias (left arm) and myalgias (right leg ). Negative for back pain, gait problem, neck pain and neck stiffness.  Skin: Positive for wound.  Neurological: Positive for headaches. Negative for dizziness, seizures and light-headedness.  Psychiatric/Behavioral: Negative for agitation, behavioral problems, confusion and decreased concentration.    Patient Active Problem List   Diagnosis Date Noted  . Lichen planus 16/60/6301  . Anxiety disorder 11/05/2011  . Menopause   . Murmur   . Poikiloderma   . Herpes zoster   . Degenerative disk disease   . Tinnitus   . Colon polyp     Current Outpatient Prescriptions on File Prior to Visit  Medication Sig Dispense Refill  . cetirizine (ZYRTEC) 10 MG tablet Take 1 tablet (10 mg total) by mouth daily. 30 tablet 11  . hydroxychloroquine (PLAQUENIL) 200 MG tablet Take by mouth 2 (two) times daily.    Marland Kitchen lamoTRIgine (LAMICTAL) 150 MG tablet Take 150 mg by mouth daily.     No current facility-administered medications on file prior to visit.     Allergies  Allergen Reactions  .  Codeine Hives     Objective:  BP 134/85   Pulse 96   Temp 97.5 F (36.4 C) (Oral)   Resp 16   Ht 5\' 8"  (1.727 m)   Wt 154 lb (69.9 kg)   SpO2 98%   BMI 23.42 kg/m   Physical Exam  Constitutional: She is oriented to person, place, and time and well-developed, well-nourished, and in no distress. No distress.  HENT:  Head: Head is with contusion (posterior occiput ). Head is without raccoon's eyes, without Battle's sign, without abrasion and without laceration.  Eyes: Conjunctivae and EOM are normal. Pupils are equal, round, and reactive to light.  Neck: Normal range of motion and full passive range of motion without pain. No spinous process tenderness and no muscular tenderness present.  Cardiovascular: Normal rate, regular rhythm and normal heart sounds.   Neurological: She is alert and oriented to person, place, and time. She has normal motor skills, normal sensation, normal strength and normal reflexes. She is not agitated and not disoriented. She displays normal speech. She has a normal Heel to [facility]. Gait normal. GCS score is 15.  Skin: Skin is warm and dry. Abrasion (superficial posterior left forearm) and bruising (right anterior lower extremety ) noted.  Psychiatric: Mood, memory, affect and judgment normal.  Vitals reviewed.   Assessment and Plan :  1. Fall down stairs, initial encounter 2. Contusion of scalp, initial encounter - [place] CT algorithm suggests low suspicion for bleed. Will  not get imaging at this time. Worsening signs/symptoms that should lead her to the emergency department for evaluation discussed with patient. She understands and agrees.   3. Abrasion of left upper extremity, initial encounter 4. Contusion of right knee and lower leg, initial encounter - Abrasion washed with soap and water, mupirocin applied, arm wrapped with gauze and koban. Encouraged pt to apply ice to affected areas for the next few days. Tylenol for pain.    Mercer Pod, PA-C  [facility] 12/30/2016 12:41 PM

## 2017-02-05 ENCOUNTER — Encounter: Payer: Self-pay | Admitting: Family Medicine

## 2017-02-18 ENCOUNTER — Encounter: Payer: Self-pay | Admitting: Physician Assistant

## 2017-02-18 DIAGNOSIS — M8580 Other specified disorders of bone density and structure, unspecified site: Secondary | ICD-10-CM | POA: Insufficient documentation

## 2017-03-16 ENCOUNTER — Ambulatory Visit (INDEPENDENT_AMBULATORY_CARE_PROVIDER_SITE_OTHER): Payer: Medicare Other | Admitting: Physician Assistant

## 2017-03-16 ENCOUNTER — Encounter: Payer: Self-pay | Admitting: Physician Assistant

## 2017-03-16 VITALS — BP 130/80 | HR 71 | Temp 98.2°F | Resp 18 | Ht 68.0 in | Wt 151.8 lb

## 2017-03-16 DIAGNOSIS — F419 Anxiety disorder, unspecified: Secondary | ICD-10-CM | POA: Diagnosis not present

## 2017-03-16 DIAGNOSIS — Z1211 Encounter for screening for malignant neoplasm of colon: Secondary | ICD-10-CM | POA: Diagnosis not present

## 2017-03-16 DIAGNOSIS — Z79899 Other long term (current) drug therapy: Secondary | ICD-10-CM | POA: Diagnosis not present

## 2017-03-16 DIAGNOSIS — Z23 Encounter for immunization: Secondary | ICD-10-CM | POA: Diagnosis not present

## 2017-03-16 MED ORDER — LAMOTRIGINE 150 MG PO TABS: 150.0000 mg | ORAL_TABLET | Freq: Every day | ORAL | 6 refills | Status: DC

## 2017-03-16 MED ORDER — VENLAFAXINE HCL 37.5 MG PO TABS: 37.5000 mg | ORAL_TABLET | Freq: Every day | ORAL | 6 refills | Status: DC

## 2017-03-16 NOTE — Patient Instructions (Addendum)
You will receive a phone call to schedule your colonoscopy.  Please send a MyChart message reminding me to give you the DEXA results.  Please come back in 1 year for your second and final pneumonia vaccination.  Come back in 6 months for your medication refills.   Thank you for coming in today. I hope you feel we met your needs.  Feel free to call [facility] if you have any questions or further requests.  Please consider signing up for MyChart if you do not already have it, as this is a great way to communicate with me.  Best,  Whitney McVey, PA-C  Pneumococcal Conjugate Vaccine suspension for injection What is this medicine? PNEUMOCOCCAL VACCINE (NEU mo KOK al vak SEEN) is a vaccine used to prevent pneumococcus bacterial infections. These bacteria can cause serious infections like pneumonia, meningitis, and blood infections. This vaccine will lower your chance of getting pneumonia. If you do get pneumonia, it can make your symptoms milder and your illness shorter. This vaccine will not treat an infection and will not cause infection. This vaccine is recommended for infants and young children, adults with certain medical conditions, and adults 23 years or older. This medicine may be used for other purposes; ask your health care provider or pharmacist if you have questions. COMMON BRAND NAME(S): Prevnar, Prevnar 13 What should I tell my health care provider before I take this medicine? They need to know if you have any of these conditions: -bleeding problems -fever -immune system problems -an unusual or allergic reaction to pneumococcal vaccine, diphtheria toxoid, other vaccines, latex, other medicines, foods, dyes, or preservatives -pregnant or trying to get pregnant -breast-feeding How should I use this medicine? This vaccine is for injection into a muscle. It is given by a health care professional. A copy of Vaccine Information Statements will be given before each vaccination. Read this sheet  carefully each time. The sheet may change frequently. Talk to your pediatrician regarding the use of this medicine in children. While this drug may be prescribed for children as young as 43 weeks old for selected conditions, precautions do apply. Overdosage: If you think you have taken too much of this medicine contact a poison control center or emergency room at once. NOTE: This medicine is only for you. Do not share this medicine with others. What if I miss a dose? It is important not to miss your dose. Call your doctor or health care professional if you are unable to keep an appointment. What may interact with this medicine? -medicines for cancer chemotherapy -medicines that suppress your immune function -steroid medicines like prednisone or cortisone This list may not describe all possible interactions. Give your health care provider a list of all the medicines, herbs, non-prescription drugs, or dietary supplements you use. Also tell them if you smoke, drink alcohol, or use illegal drugs. Some items may interact with your medicine. What should I watch for while using this medicine? Mild fever and pain should go away in 3 days or less. Report any unusual symptoms to your doctor or health care professional. What side effects may I notice from receiving this medicine? Side effects that you should report to your doctor or health care professional as soon as possible: -allergic reactions like skin rash, itching or hives, swelling of the face, lips, or tongue -breathing problems -confused -fast or irregular heartbeat -fever over 102 degrees F -seizures -unusual bleeding or bruising -unusual muscle weakness Side effects that usually do not require medical attention (report  to your doctor or health care professional if they continue or are bothersome): -aches and pains -diarrhea -fever of 102 degrees F or less -headache -irritable -loss of appetite -pain, tender at site where  injected -trouble sleeping This list may not describe all possible side effects. Call your doctor for medical advice about side effects. You may report side effects to FDA at 1-800-FDA-1088. Where should I keep my medicine? This does not apply. This vaccine is given in a clinic, pharmacy, doctor's office, or other health care setting and will not be stored at home. NOTE: This sheet is a summary. It may not cover all possible information. If you have questions about this medicine, talk to your doctor, pharmacist, or health care provider.  2018 Elsevier/Gold Standard (2014-06-28 10:27:27)  IF you received an x-ray today, you will receive an invoice from [facility]. Please contact [facility] at [phone] with questions or concerns regarding your invoice.   IF you received labwork today, you will receive an invoice from [facility]. Please contact [facility] at [phone] with questions or concerns regarding your invoice.   Our billing staff will not be able to assist you with questions regarding bills from these companies.  You will be contacted with the lab results as soon as they are available. The fastest way to get your results is to activate your My Chart account. Instructions are located on the last page of this paperwork. If you have not heard from [place] regarding the results in 2 weeks, please contact this office.   Pneumococcal Conjugate Vaccine suspension for injection What is this medicine? PNEUMOCOCCAL VACCINE (NEU mo KOK al vak SEEN) is a vaccine used to prevent pneumococcus bacterial infections. These bacteria can cause serious infections like pneumonia, meningitis, and blood infections. This vaccine will lower your chance of getting pneumonia. If you do get pneumonia, it can make your symptoms milder and your illness shorter. This vaccine will not treat an infection and will not cause infection. This vaccine is recommended for infants and young children, adults with  certain medical conditions, and adults 73 years or older. This medicine may be used for other purposes; ask your health care provider or pharmacist if you have questions. COMMON BRAND NAME(S): Prevnar, Prevnar 13 What should I tell my health care provider before I take this medicine? They need to know if you have any of these conditions: -bleeding problems -fever -immune system problems -an unusual or allergic reaction to pneumococcal vaccine, diphtheria toxoid, other vaccines, latex, other medicines, foods, dyes, or preservatives -pregnant or trying to get pregnant -breast-feeding How should I use this medicine? This vaccine is for injection into a muscle. It is given by a health care professional. A copy of Vaccine Information Statements will be given before each vaccination. Read this sheet carefully each time. The sheet may change frequently. Talk to your pediatrician regarding the use of this medicine in children. While this drug may be prescribed for children as young as 32 weeks old for selected conditions, precautions do apply. Overdosage: If you think you have taken too much of this medicine contact a poison control center or emergency room at once. NOTE: This medicine is only for you. Do not share this medicine with others. What if I miss a dose? It is important not to miss your dose. Call your doctor or health care professional if you are unable to keep an appointment. What may interact with this medicine? -medicines for cancer chemotherapy -medicines that suppress your immune function -steroid medicines  like prednisone or cortisone This list may not describe all possible interactions. Give your health care provider a list of all the medicines, herbs, non-prescription drugs, or dietary supplements you use. Also tell them if you smoke, drink alcohol, or use illegal drugs. Some items may interact with your medicine. What should I watch for while using this medicine? Mild fever and  pain should go away in 3 days or less. Report any unusual symptoms to your doctor or health care professional. What side effects may I notice from receiving this medicine? Side effects that you should report to your doctor or health care professional as soon as possible: -allergic reactions like skin rash, itching or hives, swelling of the face, lips, or tongue -breathing problems -confused -fast or irregular heartbeat -fever over 102 degrees F -seizures -unusual bleeding or bruising -unusual muscle weakness Side effects that usually do not require medical attention (report to your doctor or health care professional if they continue or are bothersome): -aches and pains -diarrhea -fever of 102 degrees F or less -headache -irritable -loss of appetite -pain, tender at site where injected -trouble sleeping This list may not describe all possible side effects. Call your doctor for medical advice about side effects. You may report side effects to FDA at 1-800-FDA-1088. Where should I keep my medicine? This does not apply. This vaccine is given in a clinic, pharmacy, doctor's office, or other health care setting and will not be stored at home. NOTE: This sheet is a summary. It may not cover all possible information. If you have questions about this medicine, talk to your doctor, pharmacist, or health care provider.  2018 Elsevier/Gold Standard (2014-06-28 10:27:27)

## 2017-03-16 NOTE — Progress Notes (Signed)
   Rebecca [name]  MRN: [id number] DOB: [birth year]  PCP: Patient, No Pcp Per  Subjective:  Pt is a 66 year old female PMH anxiety disorder and depression who presents to clinic for medication refill.   H/o anxiety, depression and mood disorder - Effexor 37.5 MG qd and Lamictal 150 MG qd. She takes this daily x several years. No side effects. She sees therapist every 6 months. Denies SI and HI.   She would like pneumococcal vaccine.  She would like referral for colonoscopy.   Review of Systems  Constitutional: Negative for activity change, appetite change, fatigue and unexpected weight change.  Psychiatric/Behavioral: Positive for dysphoric mood. Negative for behavioral problems, confusion, self-injury and suicidal ideas. The patient is nervous/anxious.     Patient Active Problem List   Diagnosis Date Noted  . Osteopenia 02/18/2017  . Lichen planus 63/10/6008  . Anxiety disorder 11/05/2011  . Menopause   . Murmur   . Poikiloderma   . Herpes zoster   . Degenerative disk disease   . Tinnitus   . Colon polyp     Current Outpatient Prescriptions on File Prior to Visit  Medication Sig Dispense Refill  . cetirizine (ZYRTEC) 10 MG tablet Take 1 tablet (10 mg total) by mouth daily. 30 tablet 11  . lamoTRIgine (LAMICTAL) 150 MG tablet Take 150 mg by mouth daily.    . hydroxychloroquine (PLAQUENIL) 200 MG tablet Take by mouth 2 (two) times daily.     No current facility-administered medications on file prior to visit.     Allergies  Allergen Reactions  . Codeine Hives     Objective:  BP 130/80 (BP Location: Right Arm, Patient Position: Sitting, Cuff Size: Normal)   Pulse 71   Temp 98.2 F (36.8 C) (Oral)   Resp 18   Ht 5\' 8"  (1.727 m)   Wt 151 lb 12.8 oz (68.9 kg)   SpO2 99%   BMI 23.08 kg/m   Physical Exam  Constitutional: She is oriented to person, place, and time and well-developed, well-nourished, and in no distress. No distress.  Cardiovascular: Normal rate, regular  rhythm and normal heart sounds.   Neurological: She is alert and oriented to person, place, and time. GCS score is 15.  Skin: Skin is warm and dry.  Psychiatric: Mood, memory, affect and judgment normal.  Vitals reviewed.   Assessment and Plan :  1. Encounter for medication management 2. Anxiety disorder, unspecified type - lamoTRIgine (LAMICTAL) 150 MG tablet; Take 1 tablet (150 mg total) by mouth daily.  Dispense: 30 tablet; Refill: 6 - venlafaxine (EFFEXOR) 37.5 MG tablet; Take 1 tablet (37.5 mg total) by mouth daily.  Dispense: 30 tablet; Refill: 6 - Pt is doing well, anxiety and depression well controlled with this medication regimen. OK to refill x 6 months. RTC in 6 months.  3. Screen for colon cancer - Ambulatory referral to Gastroenterology  4. Need for vaccination with 13-polyvalent pneumococcal conjugate vaccine - Pneumococcal conjugate vaccine 13-valent IM  Mercer Pod, PA-C  [facility] [place] 03/16/2017 4:18 PM

## 2017-03-16 NOTE — Progress Notes (Deleted)
   Rebecca [name]  MRN: [id number] DOB: 04-23-1951  PCP: Patient, No Pcp Per  Subjective:  Pt is a 65 year old female who presents to clinic for medication refill.   per PT would like a refill on the generic Effexor 37.50 MG, Lamictal 150 MG Review of Systems  Patient Active Problem List   Diagnosis Date Noted  . Osteopenia 02/18/2017  . Lichen planus 53/97/6734  . Anxiety disorder 11/05/2011  . Menopause   . Murmur   . Poikiloderma   . Herpes zoster   . Degenerative disk disease   . Tinnitus   . Colon polyp     Current Outpatient Prescriptions on File Prior to Visit  Medication Sig Dispense Refill  . cetirizine (ZYRTEC) 10 MG tablet Take 1 tablet (10 mg total) by mouth daily. 30 tablet 11  . lamoTRIgine (LAMICTAL) 150 MG tablet Take 150 mg by mouth daily.    . hydroxychloroquine (PLAQUENIL) 200 MG tablet Take by mouth 2 (two) times daily.     No current facility-administered medications on file prior to visit.     Allergies  Allergen Reactions  . Codeine Hives     Objective:  Pulse 71   Temp 98.2 F (36.8 C) (Oral)   Resp 18   Ht 5\' 8"  (1.727 m)   Wt 151 lb 12.8 oz (68.9 kg)   SpO2 99%   BMI 23.08 kg/m   Physical Exam  Assessment and Plan :  There are no diagnoses linked to this encounter.   Mercer Pod, PA-C  [facility] [place] 03/16/2017 4:13 PM

## 2017-03-29 ENCOUNTER — Encounter: Payer: Self-pay | Admitting: Physician Assistant

## 2017-03-29 ENCOUNTER — Encounter: Payer: Self-pay | Admitting: Family Medicine

## 2017-03-31 ENCOUNTER — Encounter: Payer: Self-pay | Admitting: Family Medicine

## 2017-04-26 ENCOUNTER — Encounter: Payer: Self-pay | Admitting: Physician Assistant

## 2017-04-30 ENCOUNTER — Other Ambulatory Visit: Payer: Self-pay | Admitting: Physician Assistant

## 2017-04-30 NOTE — Telephone Encounter (Signed)
Can we please track down this pt's bone density results? I cannot find them in her chart or my box. Please find out where they were done and have them faxed here. Thank you!

## 2017-05-04 ENCOUNTER — Other Ambulatory Visit: Payer: Self-pay | Admitting: Physician Assistant

## 2017-05-04 DIAGNOSIS — F419 Anxiety disorder, unspecified: Secondary | ICD-10-CM

## 2017-05-04 MED ORDER — VENLAFAXINE HCL ER 37.5 MG PO CP24: 37.5000 mg | ORAL_CAPSULE | Freq: Every day | ORAL | 5 refills | Status: DC

## 2017-05-04 NOTE — Telephone Encounter (Signed)
Please call [facility] and have them fax this pt's bone scan results to me. Thank you!

## 2017-05-06 ENCOUNTER — Telehealth: Payer: Self-pay

## 2017-05-06 ENCOUNTER — Encounter: Payer: Self-pay | Admitting: Physician Assistant

## 2017-05-06 NOTE — Telephone Encounter (Signed)
Thank you so much

## 2017-05-06 NOTE — Telephone Encounter (Signed)
[facility] wanted results of bone density. IC and found she had it 02/05/2017 @ [facility]. They faxed report over - copy given to [place] and copy sent for scanning.

## 2017-05-20 ENCOUNTER — Encounter: Payer: Self-pay | Admitting: Physician Assistant

## 2017-07-06 DIAGNOSIS — M8580 Other specified disorders of bone density and structure, unspecified site: Secondary | ICD-10-CM

## 2017-07-13 ENCOUNTER — Telehealth: Payer: Self-pay | Admitting: Gastroenterology

## 2017-07-13 NOTE — Telephone Encounter (Signed)
Received patient previous gi records from [facility]. Previous referral in epic for pt to be seen for colon, and not requesting certain doctor. Records placed on DOD for records 06/13/17; Dr.Jacobs desk for review.

## 2017-07-13 NOTE — Telephone Encounter (Signed)
I reviewed a colonoscopy report from Dr. Ferdinand Lango at [facility]. Colonoscopy November 2013 done for "family history of colon polyps". The examination was to the cecum. No polyps were found. As is nearly universal for Dr. Ferdinand Lango' colonoscopy reports that I have reviewed (20-30 in the past many years) the prep was noted to be "inadequate" and he recommended a repeat be done colonoscopy at 3 year interval. He also documented that the "colon mucosa was unable to be viewed secondary to inadequate or poor prep".  Nearly every colonoscopy report that I have seen from Dr. Ferdinand Lango states the same thing, "inadequate or poor prep" and he recommends a repeat colonoscopy at early interval. It is impossible for me to tell if this is factual and so I recommend that she have repeat colonoscopy now at her soonest convenience. She should have our double prep protocol.

## 2017-07-14 NOTE — Telephone Encounter (Signed)
Left message on machine to call back  

## 2017-07-15 NOTE — Telephone Encounter (Signed)
Patient returning Patty's call  °

## 2017-07-15 NOTE — Telephone Encounter (Signed)
The pt has been advised and scheduled for a previsit and colon.  She will call with any questions or concerns

## 2017-07-17 ENCOUNTER — Encounter: Payer: Self-pay | Admitting: Physician Assistant

## 2017-07-17 ENCOUNTER — Ambulatory Visit (INDEPENDENT_AMBULATORY_CARE_PROVIDER_SITE_OTHER): Payer: Medicare Other | Admitting: Physician Assistant

## 2017-07-17 ENCOUNTER — Ambulatory Visit (INDEPENDENT_AMBULATORY_CARE_PROVIDER_SITE_OTHER): Payer: Medicare Other

## 2017-07-17 VITALS — BP 132/80 | HR 78 | Temp 98.1°F | Resp 16 | Ht 68.0 in | Wt 153.2 lb

## 2017-07-17 DIAGNOSIS — M25561 Pain in right knee: Secondary | ICD-10-CM

## 2017-07-17 MED ORDER — PREDNISONE 20 MG PO TABS: ORAL_TABLET | ORAL | 0 refills | Status: AC

## 2017-07-17 MED ORDER — PREDNISONE 20 MG PO TABS: ORAL_TABLET | ORAL | 0 refills | Status: DC

## 2017-07-17 NOTE — Patient Instructions (Signed)
     IF you received an x-ray today, you will receive an invoice from [facility]. Please contact [facility] at [phone] with questions or concerns regarding your invoice.   IF you received labwork today, you will receive an invoice from [facility]. Please contact [facility] at [phone] with questions or concerns regarding your invoice.   Our billing staff will not be able to assist you with questions regarding bills from these companies.  You will be contacted with the lab results as soon as they are available. The fastest way to get your results is to activate your My Chart account. Instructions are located on the last page of this paperwork. If you have not heard from us regarding the results in 2 weeks, please contact this office.     

## 2017-07-17 NOTE — Progress Notes (Signed)
07/17/2017 2:58 PM   DOB: Jun 29, 1951 / MRN: [id number]  SUBJECTIVE:  Rebecca [name] is a 66 y.o. female presenting for right sided knee pain that is only worse with weight bearing.  The pain has been present for about 2 months and is not getting better or worse. She has tried OTC aleve daily for the last two weeks without any improvement. Denies swelling.   She is allergic to codeine.   She  has a past medical history of Anxiety; Colon polyp; Degenerative disk disease; Herpes zoster; Menopause; Murmur; Osteopenia; Poikiloderma; and Tinnitus.    She  reports that she quit smoking about 38 years ago. Her smoking use included Cigarettes. She smoked 1.00 pack per day. She has never used smokeless tobacco. She reports that she drinks alcohol. She reports that she does not use drugs. She  reports that she currently engages in sexual activity. The patient  has a past surgical history that includes Hemorrhoid surgery.  Her family history is not on file.  Review of Systems  Constitutional: Negative for chills, diaphoresis and fever.  Respiratory: Negative for shortness of breath.   Cardiovascular: Negative for chest pain, orthopnea and leg swelling.  Gastrointestinal: Negative for nausea.  Skin: Negative for rash.  Neurological: Negative for dizziness.    The problem list and medications were reviewed and updated by myself where necessary and exist elsewhere in the encounter.   OBJECTIVE:  BP 132/80 (BP Location: Left Arm, Patient Position: Sitting, Cuff Size: Normal)   Pulse 78   Temp 98.1 F (36.7 C) (Oral)   Resp 16   Ht 5\' 8"  (1.727 m)   Wt 153 lb 3.2 oz (69.5 kg)   SpO2 99%   BMI 23.29 kg/m   Physical Exam  Constitutional: She is active.  Non-toxic appearance.  Cardiovascular: Normal rate.   Pulmonary/Chest: Effort normal. No tachypnea.  Musculoskeletal: She exhibits tenderness (just medila to the popliteal fossa, no mass, rash, warmth).       Legs: Neurological: She is alert.   Skin: Skin is warm and dry. She is not diaphoretic. No pallor.    No results found for this or any previous visit (from the past 72 hour(s)).  Dg Knee Complete 4 Views Right  Result Date: 07/17/2017 CLINICAL DATA:  Rt Knee pain -NKI- EXAM: RIGHT KNEE - COMPLETE 4+ VIEW COMPARISON:  None. FINDINGS: No fracture of the proximal tibia or distal femur. Patella is normal. No joint effusion. IMPRESSION: No acute osseous abnormality.  No significant arthropathy. Electronically Signed   By: Suzy Bouchard M.D.   On: 07/17/2017 14:12    ASSESSMENT AND PLAN:  [name] was seen today for knee pain.  Diagnoses and all orders for this visit:  Right knee pain, unspecified chronicity: She has tried and failed otc alleve. RADS negative.  Will try some prednisone.  US of the area in question ordered non stat.  -     DG Knee Complete 4 Views Right -     [place] LT LOWER EXTREM LTD SOFT TISSUE NON VASCULAR; Future  Other orders -     Discontinue: predniSONE (DELTASONE) 20 MG tablet; Take 3 in the morning for 3 days, then 2 in the morning for 3 days, and then 1 in the morning for 3 days. -     predniSONE (DELTASONE) 20 MG tablet; Take 3 in the morning for 3 days, then 2 in the morning for 3 days, and then 1 in the morning for 3 days.  The patient is advised to call or return to clinic if she does not see an improvement in symptoms, or to seek the care of the closest emergency department if she worsens with the above plan.   Philis Fendt, MHS, PA-C [facility] 07/17/2017 2:58 PM

## 2017-08-03 ENCOUNTER — Encounter: Payer: Self-pay | Admitting: Gastroenterology

## 2017-08-03 ENCOUNTER — Ambulatory Visit (AMBULATORY_SURGERY_CENTER): Payer: Self-pay

## 2017-08-03 VITALS — Ht 68.0 in | Wt 157.4 lb

## 2017-08-03 DIAGNOSIS — Z1211 Encounter for screening for malignant neoplasm of colon: Secondary | ICD-10-CM

## 2017-08-03 MED ORDER — NA SULFATE-K SULFATE-MG SULF 17.5-3.13-1.6 GM/177ML PO SOLN: ORAL | 0 refills | Status: DC

## 2017-08-03 NOTE — Progress Notes (Signed)
Per pt, no allergies to soy or egg products.Pt not taking any weight loss meds or using  O2 at home.  Emmi video sent to pt's email. 

## 2017-08-09 ENCOUNTER — Encounter: Payer: Self-pay | Admitting: Gastroenterology

## 2017-08-09 ENCOUNTER — Ambulatory Visit (AMBULATORY_SURGERY_CENTER): Payer: Medicare Other | Admitting: Gastroenterology

## 2017-08-09 VITALS — BP 130/74 | HR 79 | Temp 97.7°F | Resp 20 | Ht 68.0 in | Wt 157.0 lb

## 2017-08-09 DIAGNOSIS — Z1212 Encounter for screening for malignant neoplasm of rectum: Secondary | ICD-10-CM | POA: Diagnosis not present

## 2017-08-09 DIAGNOSIS — Z1211 Encounter for screening for malignant neoplasm of colon: Secondary | ICD-10-CM

## 2017-08-09 DIAGNOSIS — D123 Benign neoplasm of transverse colon: Secondary | ICD-10-CM

## 2017-08-09 DIAGNOSIS — K635 Polyp of colon: Secondary | ICD-10-CM

## 2017-08-09 MED ORDER — SODIUM CHLORIDE 0.9 % IV SOLN: 500.0000 mL | INTRAVENOUS | Status: DC

## 2017-08-09 NOTE — Patient Instructions (Signed)
YOU HAD AN ENDOSCOPIC PROCEDURE TODAY AT THE [facility]:   Refer to the procedure report that was given to you for any specific questions about what was found during the examination.  If the procedure report does not answer your questions, please call your gastroenterologist to clarify.  If you requested that your care partner not be given the details of your procedure findings, then the procedure report has been included in a sealed envelope for you to review at your convenience later.  YOU SHOULD EXPECT: Some feelings of bloating in the abdomen. Passage of more gas than usual.  Walking can help get rid of the air that was put into your GI tract during the procedure and reduce the bloating. If you had a lower endoscopy (such as a colonoscopy or flexible sigmoidoscopy) you may notice spotting of blood in your stool or on the toilet paper. If you underwent a bowel prep for your procedure, you may not have a normal bowel movement for a few days.  Please Note:  You might notice some irritation and congestion in your nose or some drainage.  This is from the oxygen used during your procedure.  There is no need for concern and it should clear up in a day or so.  SYMPTOMS TO REPORT IMMEDIATELY:   Following lower endoscopy (colonoscopy or flexible sigmoidoscopy):  Excessive amounts of blood in the stool  Significant tenderness or worsening of abdominal pains  Swelling of the abdomen that is new, acute  Fever of 100F or higher  For urgent or emergent issues, a gastroenterologist can be reached at any hour by calling [phone].   DIET:  We do recommend a small meal at first, but then you may proceed to your regular diet.  Drink plenty of fluids but you should avoid alcoholic beverages for 24 hours.  ACTIVITY:  You should plan to take it easy for the rest of today and you should NOT DRIVE or use heavy machinery until tomorrow (because of the sedation medicines used during the test).     FOLLOW UP: Our staff will call the number listed on your records the next business day following your procedure to check on you and address any questions or concerns that you may have regarding the information given to you following your procedure. If we do not reach you, we will leave a message.  However, if you are feeling well and you are not experiencing any problems, there is no need to return our call.  We will assume that you have returned to your regular daily activities without incident.  If any biopsies were taken you will be contacted by phone or by letter within the next 1-3 weeks.  Please call us at [phone] if you have not heard about the biopsies in 3 weeks.   Await for biopsy results to determine next repeat Colonoscopy screening Polyps (handout given)  SIGNATURES/CONFIDENTIALITY: You and/or your care partner have signed paperwork which will be entered into your electronic medical record.  These signatures attest to the fact that that the information above on your After Visit Summary has been reviewed and is understood.  Full responsibility of the confidentiality of this discharge information lies with you and/or your care-partner. 

## 2017-08-09 NOTE — Progress Notes (Signed)
Report to PACU, RN, vss, BBS= Clear.  

## 2017-08-09 NOTE — Progress Notes (Signed)
Pt's states no medical or surgical changes since previsit or office visit. 

## 2017-08-09 NOTE — Progress Notes (Signed)
Called to room to assist during endoscopic procedure.  Patient ID and intended procedure confirmed with present staff. Received instructions for my participation in the procedure from the performing physician.  

## 2017-08-09 NOTE — Op Note (Signed)
[facility] Patient Name: Rebecca [name] Procedure Date: 08/09/2017 8:17 AM MRN: [id number] Endoscopist: Milus Banister , MD Age: 66 Referring MD:  Date of Birth: 1951-01-07 Gender: Female Account #: [id number] Procedure:                Colonoscopy Indications:              Screening for colorectal malignant neoplasm;                            colonoscoy Dr. Ferdinand Lango 2013 "inadequate prep" Medicines:                Monitored Anesthesia Care Procedure:                Pre-Anesthesia Assessment:                           - Prior to the procedure, a History and Physical                            was performed, and patient medications and                            allergies were reviewed. The patient's tolerance of                            previous anesthesia was also reviewed. The risks                            and benefits of the procedure and the sedation                            options and risks were discussed with the patient.                            All questions were answered, and informed consent                            was obtained. Prior Anticoagulants: The patient has                            taken no previous anticoagulant or antiplatelet                            agents. ASA Grade Assessment: II - A patient with                            mild systemic disease. After reviewing the risks                            and benefits, the patient was deemed in                            satisfactory condition to undergo the procedure.  After obtaining informed consent, the colonoscope                            was passed under direct vision. Throughout the                            procedure, the patient's blood pressure, pulse, and                            oxygen saturations were monitored continuously. The                            Colonoscope was introduced through the anus and                            advanced to the the  cecum, identified by                            appendiceal orifice and ileocecal valve. The                            colonoscopy was performed without difficulty. The                            patient tolerated the procedure well. The quality                            of the bowel preparation was excellent. The                            ileocecal valve, appendiceal orifice, and rectum                            were photographed. Scope In: 8:27:28 AM Scope Out: 8:44:03 AM Scope Withdrawal Time: 0 hours 9 minutes 31 seconds  Total Procedure Duration: 0 hours 16 minutes 35 seconds  Findings:                 A 2 mm polyp was found in the transverse colon. The                            polyp was sessile. The polyp was removed with a                            cold snare. Resection and retrieval were complete.                           The exam was otherwise without abnormality on                            direct and retroflexion views. Complications:            No immediate complications. Estimated blood loss:  None. Estimated Blood Loss:     Estimated blood loss: none. Impression:               - One 2 mm polyp in the transverse colon, removed                            with a cold snare. Resected and retrieved.                           - The examination was otherwise normal on direct                            and retroflexion views. Recommendation:           - Patient has a contact number available for                            emergencies. The signs and symptoms of potential                            delayed complications were discussed with the                            patient. Return to normal activities tomorrow.                            Written discharge instructions were provided to the                            patient.                           - Resume previous diet.                           - Continue present medications.                            You will receive a letter within 2-3 weeks with the                            pathology results and my final recommendations.                           If the polyp(s) is proven to be 'pre-cancerous' on                            pathology, you will need repeat colonoscopy in 5                            years. If the polyp(s) is NOT 'precancerous' on                            pathology then you should repeat colon cancer  screening in 10 years with colonoscopy without need                            for colon cancer screening by any method prior to                            then (including stool testing). Milus Banister, MD 08/09/2017 8:46:00 AM This report has been signed electronically.

## 2017-08-10 ENCOUNTER — Telehealth: Payer: Self-pay | Admitting: *Deleted

## 2017-08-10 NOTE — Telephone Encounter (Signed)
  Follow up Call-  Call back number 08/09/2017  Post procedure Call Back phone  # [phone]  Permission to leave phone message Yes  Some recent data might be hidden    [facility]

## 2017-08-10 NOTE — Telephone Encounter (Signed)
Pt returned call and said she is doing good since her procedure

## 2017-08-10 NOTE — Telephone Encounter (Signed)
  Follow up Call-  Call back number 08/09/2017  Post procedure Call Back phone  # [phone]  Permission to leave phone message Yes  Some recent data might be hidden   [facility]

## 2017-08-13 ENCOUNTER — Encounter: Payer: Self-pay | Admitting: Gastroenterology

## 2017-08-19 ENCOUNTER — Encounter: Payer: Self-pay | Admitting: Family Medicine

## 2017-08-19 ENCOUNTER — Ambulatory Visit (INDEPENDENT_AMBULATORY_CARE_PROVIDER_SITE_OTHER): Payer: Medicare Other | Admitting: Family Medicine

## 2017-08-19 ENCOUNTER — Ambulatory Visit (INDEPENDENT_AMBULATORY_CARE_PROVIDER_SITE_OTHER): Payer: Medicare Other

## 2017-08-19 ENCOUNTER — Encounter: Payer: Medicare Other | Admitting: Family Medicine

## 2017-08-19 VITALS — BP 120/82 | HR 91 | Ht 68.0 in | Wt 154.4 lb

## 2017-08-19 VITALS — BP 120/82 | HR 91 | Resp 17 | Ht 68.0 in | Wt 154.6 lb

## 2017-08-19 DIAGNOSIS — Z0000 Encounter for general adult medical examination without abnormal findings: Secondary | ICD-10-CM | POA: Diagnosis not present

## 2017-08-19 DIAGNOSIS — Z23 Encounter for immunization: Secondary | ICD-10-CM

## 2017-08-19 DIAGNOSIS — Z01810 Encounter for preprocedural cardiovascular examination: Secondary | ICD-10-CM

## 2017-08-19 DIAGNOSIS — F419 Anxiety disorder, unspecified: Secondary | ICD-10-CM

## 2017-08-19 DIAGNOSIS — Z13220 Encounter for screening for lipoid disorders: Secondary | ICD-10-CM | POA: Diagnosis not present

## 2017-08-19 LAB — POCT URINALYSIS DIP (MANUAL ENTRY)
Bilirubin, UA: NEGATIVE
Blood, UA: NEGATIVE
Glucose, UA: NEGATIVE mg/dL
Ketones, POC UA: NEGATIVE mg/dL
LEUKOCYTES UA: NEGATIVE
NITRITE UA: NEGATIVE
PROTEIN UA: NEGATIVE mg/dL
SPEC GRAV UA: 1.015 (ref 1.010–1.025)
UROBILINOGEN UA: 0.2 U/dL
pH, UA: 7 (ref 5.0–8.0)

## 2017-08-19 MED ORDER — VENLAFAXINE HCL ER 37.5 MG PO CP24: 37.5000 mg | ORAL_CAPSULE | Freq: Every day | ORAL | 1 refills | Status: DC

## 2017-08-19 MED ORDER — LAMOTRIGINE 150 MG PO TABS: 150.0000 mg | ORAL_TABLET | Freq: Every day | ORAL | 1 refills | Status: DC

## 2017-08-19 NOTE — Progress Notes (Signed)
Subjective:   Rebecca [name] is a 66 y.o. female who presents for an Initial Medicare Annual Wellness Visit.  Review of Systems    N/A  Cardiac Risk Factors include: advanced age (>80men, >25 women)     Objective:    Today's Vitals   08/19/17 0824  BP: 120/82  Pulse: 91  SpO2: 100%  Weight: 154 lb 6 oz (70 kg)  Height: 5\' 8"  (1.727 m)   Body mass index is 23.47 kg/m.   Current Medications (verified) Outpatient Encounter Medications as of 08/19/2017  Medication Sig  . cetirizine (ZYRTEC) 10 MG tablet Take 1 tablet (10 mg total) by mouth daily.  Marland Kitchen lamoTRIgine (LAMICTAL) 150 MG tablet Take 1 tablet (150 mg total) by mouth daily.  . tranexamic acid (LYSTEDA) 650 MG TABS tablet Take 650 mg daily by mouth.  . venlafaxine XR (EFFEXOR-XR) 37.5 MG 24 hr capsule Take 1 capsule (37.5 mg total) by mouth daily.  . [DISCONTINUED] bisacodyl (DULCOLAX) 5 MG EC tablet Take 5 mg by mouth. Dulcolax 5 mg bowel prep #4-Take as directed  . [DISCONTINUED] hydroxychloroquine (PLAQUENIL) 200 MG tablet Take by mouth 2 (two) times daily.  . [DISCONTINUED] Polyethylene Glycol 3350 (MIRALAX PO) Take by mouth. Miralax 119 gm bowel prep-Take as directed   No facility-administered encounter medications on file as of 08/19/2017.     Allergies (verified) Codeine   History: Past Medical History:  Diagnosis Date  . Anxiety   . Colon polyp    2007  . Degenerative disk disease    C5-C6  . Herpes zoster    2011  . Menopause   . Murmur   . Osteopenia   . Poikiloderma   . Tinnitus    L ear   Past Surgical History:  Procedure Laterality Date  . CESAREAN SECTION     2 times  . COLONOSCOPY    . HEMORRHOID SURGERY    . TONSILLECTOMY     as a child   History reviewed. No pertinent family history. Social History   Occupational History  . Occupation: Art therapist: SELF EMPLOYED  Tobacco Use  . Smoking status: Former Smoker    Packs/day: 1.00    Types: Cigarettes    Last  attempt to quit: 11/04/1978    Years since quitting: 38.8  . Smokeless tobacco: Never Used  Substance and Sexual Activity  . Alcohol use: Yes    Alcohol/week: 4.2 oz    Types: 7 Standard drinks or equivalent per week    Comment:  usually 1 drink nightly  . Drug use: No  . Sexual activity: Yes    Tobacco Counseling Counseling given: Not Answered   Activities of Daily Living In your present state of health, do you have any difficulty performing the following activities: 08/19/2017  Hearing? N  Vision? N  Difficulty concentrating or making decisions? Y  Comment Patient has issues with remembering things.   Walking or climbing stairs? N  Dressing or bathing? N  Doing errands, shopping? N  Preparing Food and eating ? N  Using the Toilet? N  In the past six months, have you accidently leaked urine? Y  Comment Patient has issues with urine leakage when she sneezes  Do you have problems with loss of bowel control? N  Managing your Medications? N  Managing your Finances? N  Housekeeping or managing your Housekeeping? N  Some recent data might be hidden    Immunizations and Health Maintenance Immunization History  Administered  Date(s) Administered  . DTaP 09/23/2009  . Hepatitis A 09/23/2009, 08/21/2010  . Influenza,inj,Quad PF,6+ Mos 07/27/2013, 06/05/2016, 08/19/2017  . Pneumococcal Conjugate-13 03/16/2017  . Zoster 03/06/2011   There are no preventive care reminders to display for this patient.  Patient Care Team: Forrest Moron, MD as PCP - General (Internal Medicine) Ralene Bathe, MD as Consulting Physician (Ophthalmology) Loney Loh, MD (Dermatology)  Indicate any recent Medical Services you may have received from other than [facility] providers in the past year (date may be approximate).     Assessment:   This is a routine wellness examination for [place].   Hearing/Vision screen Hearing Screening Comments: Patient had a hearing exam done about 7 years ago  and she passed.  Vision Screening Comments: Patient sees Dr. Claudean Kinds for her routine eye exams on a yearly basis.   Dietary issues and exercise activities discussed: Current Exercise Habits: The patient does not participate in regular exercise at present, Exercise limited by: None identified  Goals    . Exercise 3x per week (30 min per time)     Patient states that she wants to try to start exercising more on a consistent basis and hopefully start losing some weight.       Depression Screen PHQ 2/9 Scores 08/19/2017 07/17/2017 03/16/2017 12/30/2016 09/01/2016 08/11/2016 06/19/2016  PHQ - 2 Score 0 0 0 0 1 0 0  Exception Documentation - - - Medical reason - - -    Fall Risk Fall Risk  08/19/2017 07/17/2017 03/16/2017 12/30/2016 09/01/2016  Falls in the past year? Yes No Yes Yes No  Number falls in past yr: 1 - 1 1 -  Injury with Fall? No - No Yes -  Risk for fall due to : (No Data) - - - -  Risk for fall due to: Comment Patient does not have issues with her balance. She states it was just a simple accident as to why she fell.  - - - -  Follow up Falls prevention discussed - - - -    Cognitive Function:     6CIT Screen 08/19/2017  What Year? 0 points  What month? 0 points  What time? 0 points  Count back from 20 0 points  Months in reverse 0 points  Repeat phrase 0 points  Total Score 0    Screening Tests Health Maintenance  Topic Date Due  . TETANUS/TDAP  08/19/2018 (Originally 10/29/1969)  . PNA vac Low Risk Adult (2 of 2 - PPSV23) 03/16/2018  . MAMMOGRAM  02/09/2019  . COLONOSCOPY  08/10/2027  . INFLUENZA VACCINE  Completed  . DEXA SCAN  Completed  . Hepatitis C Screening  Completed      Plan:   I have personally reviewed and noted the following in the patient's chart:   . Medical and social history . Use of alcohol, tobacco or illicit drugs  . Current medications and supplements . Functional ability and status . Nutritional status . Physical  activity . Advanced directives . List of other physicians . Hospitalizations, surgeries, and ER visits in previous 12 months . Vitals . Screenings to include cognitive, depression, and falls . Referrals and appointments  In addition, I have reviewed and discussed with patient certain preventive protocols, quality metrics, and best practice recommendations. A written personalized care plan for preventive services as well as general preventive health recommendations were provided to patient.   1. Encounter for Medicare annual wellness exam  2. Need for immunization against influenza - Flu  Vaccine QUAD 6+ mos IM (Fluarix)   Andrez Grime, LPN   37/90/2409

## 2017-08-19 NOTE — Progress Notes (Addendum)
Chief Complaint  Patient presents with  . Annual Exam    no pap    HPI  Pt is here to get preop clearance for her upcoming cosmetic eye surgery and to get med refills She had her medical wellness exam for medicare this morning She has no other concerns   Past Medical History:  Diagnosis Date  . Anxiety   . Colon polyp    2007  . Degenerative disk disease    C5-C6  . Herpes zoster    2011  . Menopause   . Murmur   . Osteopenia   . Poikiloderma   . Tinnitus    L ear    Current Outpatient Medications  Medication Sig Dispense Refill  . cetirizine (ZYRTEC) 10 MG tablet Take 1 tablet (10 mg total) by mouth daily. 30 tablet 11  . lamoTRIgine (LAMICTAL) 150 MG tablet Take 1 tablet (150 mg total) daily by mouth. 90 tablet 1  . tranexamic acid (LYSTEDA) 650 MG TABS tablet Take 650 mg daily by mouth.    . venlafaxine XR (EFFEXOR-XR) 37.5 MG 24 hr capsule Take 1 capsule (37.5 mg total) daily by mouth. 90 capsule 1   No current facility-administered medications for this visit.     Allergies:  Allergies  Allergen Reactions  . Codeine Hives    Past Surgical History:  Procedure Laterality Date  . CESAREAN SECTION     2 times  . COLONOSCOPY    . HEMORRHOID SURGERY    . TONSILLECTOMY     as a child    Social History   Socioeconomic History  . Marital status: Married    Spouse name: None  . Number of children: None  . Years of education: None  . Highest education level: Some college, no degree  Social Needs  . Financial resource strain: Not hard at all  . Food insecurity - worry: Never true  . Food insecurity - inability: Never true  . Transportation needs - medical: No  . Transportation needs - non-medical: No  Occupational History  . Occupation: Art therapist: SELF EMPLOYED  Tobacco Use  . Smoking status: Former Smoker    Packs/day: 1.00    Types: Cigarettes    Last attempt to quit: 11/04/1978    Years since quitting: 38.8  . Smokeless  tobacco: Never Used  Substance and Sexual Activity  . Alcohol use: Yes    Alcohol/week: 4.2 oz    Types: 7 Standard drinks or equivalent per week    Comment:  usually 1 drink nightly  . Drug use: No  . Sexual activity: Yes  Other Topics Concern  . None  Social History Narrative  . None    No family history on file.   Review of Systems  Constitutional: Negative for chills, fever, malaise/fatigue and weight loss.  HENT: Negative for ear discharge, ear pain and hearing loss.   Eyes: Negative for blurred vision and double vision.  Respiratory: Negative for cough, shortness of breath and wheezing.   Cardiovascular: Negative for chest pain and palpitations.  Gastrointestinal: Negative for abdominal pain, constipation, diarrhea, heartburn, nausea and vomiting.  Genitourinary: Negative for dysuria and urgency.  Skin: Negative for itching and rash.  Neurological: Negative for dizziness, tingling and headaches.  Psychiatric/Behavioral: Negative for depression. The patient is not nervous/anxious.       Objective: Vitals:   08/19/17 0904  BP: 120/82  Pulse: 91  Resp: 17  SpO2: 100%  Weight: 154 lb 9.6  oz (70.1 kg)  Height: 5\' 8"  (1.727 m)    Physical Exam  Constitutional: She is oriented to person, place, and time. She appears well-developed and well-nourished.  HENT:  Head: Normocephalic and atraumatic.  Right Ear: External ear normal.  Left Ear: External ear normal.  Mouth/Throat: Oropharynx is clear and moist.  Eyes: Conjunctivae and EOM are normal.  Cardiovascular: Normal rate, regular rhythm and normal heart sounds.  No murmur heard. Pulmonary/Chest: Effort normal and breath sounds normal. No stridor. No respiratory distress. Right breast exhibits no inverted nipple, no mass, no nipple discharge, no skin change and no tenderness. Left breast exhibits no inverted nipple, no mass, no nipple discharge, no skin change and no tenderness. Breasts are symmetrical. There is no  breast swelling.  Abdominal: Soft. Bowel sounds are normal. She exhibits no distension. There is no tenderness. There is no guarding.  Genitourinary: No breast tenderness, discharge or bleeding.  Neurological: She is alert and oriented to person, place, and time.  Skin: Skin is warm. Capillary refill takes less than 2 seconds.  Psychiatric: She has a normal mood and affect. Her behavior is normal. Judgment and thought content normal.     ECG - NSR   Assessment and Plan [place] was seen today for annual exam.  Diagnoses and all orders for this visit:  Pre-operative cardiovascular examination- discussed upcoming cosmetic surgery  -     CBC with Differential/Platelet -     Comprehensive metabolic panel -     POCT urinalysis dipstick -     EKG 12-Lead  Anxiety disorder, unspecified type- discussed medications and compliance Pt is doing well -     lamoTRIgine (LAMICTAL) 150 MG tablet; Take 1 tablet (150 mg total) daily by mouth. -     venlafaxine XR (EFFEXOR-XR) 37.5 MG 24 hr capsule; Take 1 capsule (37.5 mg total) daily by mouth.  Screening, lipid -     Lipid panel      Christino Mcglinchey A Nolon Rod

## 2017-08-19 NOTE — Patient Instructions (Signed)
     IF you received an x-ray today, you will receive an invoice from [facility]. Please contact [facility] at [phone] with questions or concerns regarding your invoice.   IF you received labwork today, you will receive an invoice from [facility]. Please contact [facility] at [phone] with questions or concerns regarding your invoice.   Our billing staff will not be able to assist you with questions regarding bills from these companies.  You will be contacted with the lab results as soon as they are available. The fastest way to get your results is to activate your My Chart account. Instructions are located on the last page of this paperwork. If you have not heard from us regarding the results in 2 weeks, please contact this office.    We recommend that you schedule a mammogram for breast cancer screening. Typically, you do not need a referral to do this. Please contact a local imaging center to schedule your mammogram.  [facility] - [phone]  *ask for the Radiology Department [facility]) - [phone] or [phone]  [facility] - [phone] [facility] - [phone] [facility] - [phone]  *ask for the Radiology Department [facility] - [phone]  *ask for the Radiology Department [facility] - [phone]  *ask for the Mammography Department [facility] - [phone] 

## 2017-08-19 NOTE — Patient Instructions (Addendum)
Rebecca [name] , Thank you for taking time to come for your Medicare Wellness Visit. I appreciate your ongoing commitment to your health goals. Please review the following plan we discussed and let me know if I can assist you in the future.   Screening recommendations/referrals: Colonoscopy: up to date, next due 08/10/2027 Mammogram: up to date, next due 02/09/2019 Bone Density: up to date, next due 02/08/2022 Recommended yearly ophthalmology/optometry visit for glaucoma screening and checkup Recommended yearly dental visit for hygiene and checkup  Vaccinations: Influenza vaccine: administered today  Pneumococcal vaccine: up to date Tdap vaccine: declined due to insurance Shingles vaccine: up to date    Advanced directives: Please bring a copy of your POA (Power of [place]) and/or Living Will to your next appointment.   Conditions/risks identified: Try to start exercising more on a consistent basis and hopefully you will start losing some weight.   Next appointment: today @ 9:20 am with Dr. Nolon Rod    Preventive Care 66 Years and Older, Female Preventive care refers to lifestyle choices and visits with your health care provider that can promote health and wellness. What does preventive care include?  A yearly physical exam. This is also called an annual well check.  Dental exams once or twice a year.  Routine eye exams. Ask your health care provider how often you should have your eyes checked.  Personal lifestyle choices, including:  Daily care of your teeth and gums.  Regular physical activity.  Eating a healthy diet.  Avoiding tobacco and drug use.  Limiting alcohol use.  Practicing safe sex.  Taking low-dose aspirin every day.  Taking vitamin and mineral supplements as recommended by your health care provider. What happens during an annual well check? The services and screenings done by your health care provider during your annual well check will depend on your age,  overall health, lifestyle risk factors, and family history of disease. Counseling  Your health care provider may ask you questions about your:  Alcohol use.  Tobacco use.  Drug use.  Emotional well-being.  Home and relationship well-being.  Sexual activity.  Eating habits.  History of falls.  Memory and ability to understand (cognition).  Work and work Statistician.  Reproductive health. Screening  You may have the following tests or measurements:  Height, weight, and BMI.  Blood pressure.  Lipid and cholesterol levels. These may be checked every 5 years, or more frequently if you are over 58 years old.  Skin check.  Lung cancer screening. You may have this screening every year starting at age 76 if you have a 30-pack-year history of smoking and currently smoke or have quit within the past 15 years.  Fecal occult blood test (FOBT) of the stool. You may have this test every year starting at age 22.  Flexible sigmoidoscopy or colonoscopy. You may have a sigmoidoscopy every 5 years or a colonoscopy every 10 years starting at age 15.  Hepatitis C blood test.  Hepatitis B blood test.  Sexually transmitted disease (STD) testing.  Diabetes screening. This is done by checking your blood sugar (glucose) after you have not eaten for a while (fasting). You may have this done every 1-3 years.  Bone density scan. This is done to screen for osteoporosis. You may have this done starting at age 78.  Mammogram. This may be done every 1-2 years. Talk to your health care provider about how often you should have regular mammograms. Talk with your health care provider about your test results,  treatment options, and if necessary, the need for more tests. Vaccines  Your health care provider may recommend certain vaccines, such as:  Influenza vaccine. This is recommended every year.  Tetanus, diphtheria, and acellular pertussis (Tdap, Td) vaccine. You may need a Td booster every 10  years.  Zoster vaccine. You may need this after age 20.  Pneumococcal 13-valent conjugate (PCV13) vaccine. One dose is recommended after age 16.  Pneumococcal polysaccharide (PPSV23) vaccine. One dose is recommended after age 7. Talk to your health care provider about which screenings and vaccines you need and how often you need them. This information is not intended to replace advice given to you by your health care provider. Make sure you discuss any questions you have with your health care provider. Document Released: 10/18/2015 Document Revised: 06/10/2016 Document Reviewed: 07/23/2015 Elsevier Interactive Patient Education  2017 [place] Prevention in the Home Falls can cause injuries. They can happen to people of all ages. There are many things you can do to make your home safe and to help prevent falls. What can I do on the outside of my home?  Regularly fix the edges of walkways and driveways and fix any cracks.  Remove anything that might make you trip as you walk through a door, such as a raised step or threshold.  Trim any bushes or trees on the path to your home.  Use bright outdoor lighting.  Clear any walking paths of anything that might make someone trip, such as rocks or tools.  Regularly check to see if handrails are loose or broken. Make sure that both sides of any steps have handrails.  Any raised decks and porches should have guardrails on the edges.  Have any leaves, snow, or ice cleared regularly.  Use sand or salt on walking paths during winter.  Clean up any spills in your garage right away. This includes oil or grease spills. What can I do in the bathroom?  Use night lights.  Install grab bars by the toilet and in the tub and shower. Do not use towel bars as grab bars.  Use non-skid mats or decals in the tub or shower.  If you need to sit down in the shower, use a plastic, non-slip stool.  Keep the floor dry. Clean up any water that  spills on the floor as soon as it happens.  Remove soap buildup in the tub or shower regularly.  Attach bath mats securely with double-sided non-slip rug tape.  Do not have throw rugs and other things on the floor that can make you trip. What can I do in the bedroom?  Use night lights.  Make sure that you have a light by your bed that is easy to reach.  Do not use any sheets or blankets that are too big for your bed. They should not hang down onto the floor.  Have a firm chair that has side arms. You can use this for support while you get dressed.  Do not have throw rugs and other things on the floor that can make you trip. What can I do in the kitchen?  Clean up any spills right away.  Avoid walking on wet floors.  Keep items that you use a lot in easy-to-reach places.  If you need to reach something above you, use a strong step stool that has a grab bar.  Keep electrical cords out of the way.  Do not use floor polish or wax that makes floors  slippery. If you must use wax, use non-skid floor wax.  Do not have throw rugs and other things on the floor that can make you trip. What can I do with my stairs?  Do not leave any items on the stairs.  Make sure that there are handrails on both sides of the stairs and use them. Fix handrails that are broken or loose. Make sure that handrails are as long as the stairways.  Check any carpeting to make sure that it is firmly attached to the stairs. Fix any carpet that is loose or worn.  Avoid having throw rugs at the top or bottom of the stairs. If you do have throw rugs, attach them to the floor with carpet tape.  Make sure that you have a light switch at the top of the stairs and the bottom of the stairs. If you do not have them, ask someone to add them for you. What else can I do to help prevent falls?  Wear shoes that:  Do not have high heels.  Have rubber bottoms.  Are comfortable and fit you well.  Are closed at the  toe. Do not wear sandals.  If you use a stepladder:  Make sure that it is fully opened. Do not climb a closed stepladder.  Make sure that both sides of the stepladder are locked into place.  Ask someone to hold it for you, if possible.  Clearly mark and make sure that you can see:  Any grab bars or handrails.  First and last steps.  Where the edge of each step is.  Use tools that help you move around (mobility aids) if they are needed. These include:  Canes.  Walkers.  Scooters.  Crutches.  Turn on the lights when you go into a dark area. Replace any light bulbs as soon as they burn out.  Set up your furniture so you have a clear path. Avoid moving your furniture around.  If any of your floors are uneven, fix them.  If there are any pets around you, be aware of where they are.  Review your medicines with your doctor. Some medicines can make you feel dizzy. This can increase your chance of falling. Ask your doctor what other things that you can do to help prevent falls. This information is not intended to replace advice given to you by your health care provider. Make sure you discuss any questions you have with your health care provider. Document Released: 07/18/2009 Document Revised: 02/27/2016 Document Reviewed: 10/26/2014 Elsevier Interactive Patient Education  2017 [employer].

## 2017-08-20 LAB — COMPREHENSIVE METABOLIC PANEL
ALT: 14 IU/L (ref 0–32)
AST: 18 IU/L (ref 0–40)
Albumin/Globulin Ratio: 2.1 (ref 1.2–2.2)
Albumin: 4.7 g/dL (ref 3.6–4.8)
Alkaline Phosphatase: 67 IU/L (ref 39–117)
BUN/Creatinine Ratio: 18 (ref 12–28)
BUN: 18 mg/dL (ref 8–27)
Bilirubin Total: 0.4 mg/dL (ref 0.0–1.2)
CALCIUM: 9.9 mg/dL (ref 8.7–10.3)
CO2: 25 mmol/L (ref 20–29)
CREATININE: 0.99 mg/dL (ref 0.57–1.00)
Chloride: 102 mmol/L (ref 96–106)
GFR calc Af Amer: 69 mL/min/{1.73_m2} (ref >59)
GFR calc non Af Amer: 60 mL/min/{1.73_m2} (ref >59)
GLUCOSE: 90 mg/dL (ref 65–99)
Globulin, Total: 2.2 g/dL (ref 1.5–4.5)
Potassium: 4.5 mmol/L (ref 3.5–5.2)
Sodium: 141 mmol/L (ref 134–144)
Total Protein: 6.9 g/dL (ref 6.0–8.5)

## 2017-08-20 LAB — LIPID PANEL
CHOLESTEROL TOTAL: 213 mg/dL — AB (ref 100–199)
Chol/HDL Ratio: 2 ratio (ref 0.0–4.4)
HDL: 104 mg/dL (ref >39)
LDL Calculated: 98 mg/dL (ref 0–99)
Triglycerides: 57 mg/dL (ref 0–149)
VLDL CHOLESTEROL CAL: 11 mg/dL (ref 5–40)

## 2017-08-20 LAB — CBC WITH DIFFERENTIAL/PLATELET
BASOS ABS: 0 10*3/uL (ref 0.0–0.2)
Basos: 1 %
EOS (ABSOLUTE): 0.1 10*3/uL (ref 0.0–0.4)
Eos: 2 %
Hematocrit: 39.8 % (ref 34.0–46.6)
Hemoglobin: 13.4 g/dL (ref 11.1–15.9)
IMMATURE GRANULOCYTES: 0 %
Immature Grans (Abs): 0 10*3/uL (ref 0.0–0.1)
LYMPHS ABS: 1.7 10*3/uL (ref 0.7–3.1)
Lymphs: 51 %
MCH: 31.9 pg (ref 26.6–33.0)
MCHC: 33.7 g/dL (ref 31.5–35.7)
MCV: 95 fL (ref 79–97)
MONOS ABS: 0.3 10*3/uL (ref 0.1–0.9)
Monocytes: 9 %
NEUTROS PCT: 37 %
Neutrophils Absolute: 1.2 10*3/uL — ABNORMAL LOW (ref 1.4–7.0)
Platelets: 246 10*3/uL (ref 150–379)
RBC: 4.2 x10E6/uL (ref 3.77–5.28)
RDW: 13.1 % (ref 12.3–15.4)
WBC: 3.3 10*3/uL — AB (ref 3.4–10.8)

## 2017-10-20 HISTORY — PX: EYE SURGERY: SHX253

## 2017-10-25 ENCOUNTER — Other Ambulatory Visit: Payer: Self-pay

## 2017-10-25 ENCOUNTER — Other Ambulatory Visit: Payer: Self-pay | Admitting: Physician Assistant

## 2017-10-25 ENCOUNTER — Other Ambulatory Visit: Payer: Self-pay | Admitting: *Deleted

## 2017-10-25 ENCOUNTER — Encounter: Payer: Self-pay | Admitting: Physician Assistant

## 2017-10-25 ENCOUNTER — Ambulatory Visit: Payer: Medicare Other | Admitting: Physician Assistant

## 2017-10-25 VITALS — BP 122/64 | HR 90 | Temp 98.3°F | Resp 18 | Ht 68.27 in | Wt 155.6 lb

## 2017-10-25 DIAGNOSIS — M25561 Pain in right knee: Secondary | ICD-10-CM | POA: Diagnosis not present

## 2017-10-25 DIAGNOSIS — F419 Anxiety disorder, unspecified: Secondary | ICD-10-CM

## 2017-10-25 DIAGNOSIS — G8929 Other chronic pain: Secondary | ICD-10-CM

## 2017-10-25 MED ORDER — VENLAFAXINE HCL ER 37.5 MG PO CP24: 37.5000 mg | ORAL_CAPSULE | Freq: Every day | ORAL | 1 refills | Status: DC

## 2017-10-25 NOTE — Progress Notes (Signed)
Rebecca [name]  MRN: [id number] DOB: [birth year]  Subjective:  Rebecca [name] is a 67 y.o. female  HPI:  Knee Pain: Patient presents with knee pain involving the  right knee. Onset of the symptoms was ~5 months ago. Inciting event: none known. Current symptoms include aching. Pain is aggravated by any weight bearing and going up and down stairs.  Patient has had no prior knee problems before the time she was evaluated for the same knee pain in office in 07/2017. Evaluation to date: plain films: normal. Treatment to date: OTC analgesics which are not very effective and prednisone, which was effective for a couple of months but then the pain returned.  Has not tried any thing over the past 2 weeks because she just had cosmetic surgery was not allowed to use NSAIDs.  She can now safely use them per her cosmetic surgeon.   Review of Systems  Constitutional: Negative for chills, diaphoresis and fever.    Patient Active Problem List   Diagnosis Date Noted  . Osteopenia 02/18/2017  . Lichen planus 57/10/7791  . Anxiety disorder 11/05/2011  . Menopause   . Murmur   . Poikiloderma   . Herpes zoster   . Degenerative disk disease   . Tinnitus   . Colon polyp     Current Outpatient Medications on File Prior to Visit  Medication Sig Dispense Refill  . cetirizine (ZYRTEC) 10 MG tablet Take 1 tablet (10 mg total) by mouth daily. 30 tablet 11  . lamoTRIgine (LAMICTAL) 150 MG tablet Take 1 tablet (150 mg total) daily by mouth. 90 tablet 1  . tranexamic acid (LYSTEDA) 650 MG TABS tablet Take 650 mg daily by mouth.     No current facility-administered medications on file prior to visit.     Allergies  Allergen Reactions  . Codeine Hives     Objective:  BP 122/64 (BP Location: Left Arm, Patient Position: Sitting, Cuff Size: Normal)   Pulse 90   Temp 98.3 F (36.8 C) (Oral)   Resp 18   Ht 5' 8.27" (1.734 m)   Wt 155 lb 9.6 oz (70.6 kg)   SpO2 99%   BMI 23.47 kg/m   Physical Exam    Constitutional: She is oriented to person, place, and time and well-developed, well-nourished, and in no distress.  HENT:  Head: Normocephalic and atraumatic.  Eyes: Conjunctivae are normal.  Neck: Normal range of motion.  Pulmonary/Chest: Effort normal.  Musculoskeletal:       Right knee: She exhibits swelling (mild swelling noted at pes anserine ). She exhibits normal range of motion, no ecchymosis, no erythema, normal patellar mobility and no bony tenderness. Tenderness found. Medial joint line tenderness noted.       Left knee: Normal.  Right Knee Exam: Pain with McMurray's test, Negative anterior/posterior drawer test and valgus/varus stress test.  Right knee crepitus noted.    Neurological: She is alert and oriented to person, place, and time. Gait normal.  Reflex Scores:      Patellar reflexes are 2+ on the right side and 2+ on the left side. Strength of lower extremities 5/5   Skin: Skin is warm and dry.  Psychiatric: Affect normal.  Vitals reviewed.    Assessment and Plan :  1. Chronic pain of right knee DDx includes pes anserine bursitis versus meniscus tear.  The x-ray on 07/17/17 showed no acute osseous abnormality.  There is been no acute injury since initial evaluation.  Due to chronicity of pain, will  refer to Ortho at this time.  Recommend over-the-counter ibuprofen and Tylenol in the meantime to help with pain.  Also given a brace to use while ambulating.  Encouraged to apply ice or heat to the affected area 4-5 times a day for 10 minutes at a time.  Follow-up with orthopedics as planned. - Ambulatory referral to Orthopedic Surgery - Brace application  Tenna Delaine PA-C  [facility] [place] [facility] 10/25/2017 5:25 PM

## 2017-10-25 NOTE — Telephone Encounter (Signed)
Medication refill request for lamictal

## 2017-10-25 NOTE — Patient Instructions (Addendum)
I have given you a referral for orthopedics.  They should contact you within 1-2 weeks to schedule appointment.  In the meantime, I recommend applying ice and heat to the affected area.  Which ever works best for you apply that 4-5 times a day for 20 minutes at a time.  I have also given you brace to use daily.  Use this when you are up walking around to see if this helps with pain.  Please return here if your symptoms worsen or you develop any new concerning symptoms while awaiting orthopedic referral.  Otherwise follow-up with orthopedics. Thank you for letting me participate in your health and well being.  Can take over the counter Tylenol or Ibuprofen as needed for pain.   Pes Anserine Bursitis The pes anserine is an area on the inside of your knee, just below the joint, which is cushioned by a fluid-filled sac (bursa). Pes anserine bursitis is a condition that happens when this bursa gets swollen and irritated. The condition causes knee pain. What are the causes? This condition may be caused by:  Making the same movement over and over.  A hit to the inside of the leg.  What increases the risk? This injury is most likely to develop in:  Runners.  Athletes who play sports that involve a lot of running and quick side-to-side movements (cutting).  Athletes who play contact sports.  People who swim using an inward angle of the knee, such as with the breaststroke.  People with tight hamstring muscles.  Females.  People who are overweight.  People with flat feet.  People who have diabetes or osteoarthritis.  What are the signs or symptoms? Symptoms of this condition include:  Knee pain that gets better with rest and worse with activities like climbing stairs, walking, running, or getting in and out of a chair (common).  Swelling.  Warmth.  Tenderness when pressing at the inside of the lower leg, just below the knee.  How is this diagnosed? This condition may be diagnosed  based on:  Your symptoms.  Your medical history.  A physical exam.  Tests, such as: ? X-rays. ? MRI and ultrasound. These tests are done to check for swelling and fluid buildup in the bursa and to look at muscles and tendons.  During your physical exam, your health care provider will press on the tendon attachment to see if you feel pain. He or she may also check your hip and knee motion and strength. How is this treated? This condition may be treated by:  Resting your knee.  Avoiding activities that cause pain.  Icing the inside of your knee.  Raising (elevating) your knee while resting.  Sleeping with a pillow between your knees. This will cushion your injured knee.  Taking medicine to reduce pain and swelling.  Having medicines injected into your knee.  Doing strengthening and stretching exercises (physical therapy).  If these treatments do not work or if the condition keeps coming back, you may need to have surgery to remove the bursa. Follow these instructions at home: Managing pain, stiffness, and swelling  If directed, apply ice to your knee. ? Put ice in a plastic bag. ? Place a towel between your skin and the bag. ? Leave the ice on for 20 minutes, 2-3 times a day.  While you are sitting, elevate your knee.  While you are lying down, elevate your knee above the level of your heart.  Take over-the-counter and prescription medicines only as  told by your health care provider. Activity  Return to your normal activities as told by your health care provider. Ask your health care provider what activities are safe for you.  Do exercises as told by your health care provider. General instructions  Sleep with a pillow between your knees.  Do not use any tobacco products, such as cigarettes, chewing tobacco, and e-cigarettes. Tobacco can delay healing. If you need help quitting, ask your health care provider.  Keep all follow-up visits as told by your health care  provider. This is important. How is this prevented?  Warm up and stretch before being active.  Cool down and stretch after being active.  Give your body time to rest between periods of activity.  Make sure to use equipment that fits you.  Be safe and responsible while being active to avoid falls.  Do at least 150 minutes of moderate-intensity exercise each week, such as brisk walking or water aerobics.  Maintain physical fitness, including: ? Strength. ? Flexibility. ? Cardiovascular fitness. ? Endurance. Contact a health care provider if:  Your symptoms do not improve.  Your symptoms get worse. This information is not intended to replace advice given to you by your health care provider. Make sure you discuss any questions you have with your health care provider. Document Released: 09/21/2005 Document Revised: 05/26/2016 Document Reviewed: 09/06/2015 Elsevier Interactive Patient Education  2018 [employer].     IF you received an x-ray today, you will receive an invoice from [facility]. Please contact [facility] at [phone] with questions or concerns regarding your invoice.   IF you received labwork today, you will receive an invoice from [place]. Please contact [facility] at [phone] with questions or concerns regarding your invoice.   Our billing staff will not be able to assist you with questions regarding bills from these companies.  You will be contacted with the lab results as soon as they are available. The fastest way to get your results is to activate your My Chart account. Instructions are located on the last page of this paperwork. If you have not heard from [place] regarding the results in 2 weeks, please contact this office.

## 2018-03-01 ENCOUNTER — Encounter: Payer: Self-pay | Admitting: Family Medicine

## 2018-04-15 ENCOUNTER — Other Ambulatory Visit: Payer: Self-pay | Admitting: Family Medicine

## 2018-04-15 DIAGNOSIS — F419 Anxiety disorder, unspecified: Secondary | ICD-10-CM

## 2018-04-15 NOTE — Telephone Encounter (Signed)
lamotrigine refill Last Refill:08/19/17 # 90 Last OV: 08/19/17 PCP: Dr Nolon Rod Pharmacy:[facility] [address]

## 2018-04-16 NOTE — Telephone Encounter (Signed)
Patient is requesting a refill of the following medications: Requested Prescriptions   Pending Prescriptions Disp Refills  . venlafaxine XR (EFFEXOR-XR) 37.5 MG 24 hr capsule [Pharmacy Med Name: VENLAFAXINE ER 37.5MG  CAPSULES] 90 capsule 0    Sig: TAKE 1 CAPSULE BY MOUTH EVERY DAY    Date of patient request: 04/15/2018 Last office visit: 08/19/17 with stallings-preop cardio exam.  10/25/2017 with [facility]-chronic knee pain Date of last refill: 10/25/2017 Last refill amount: 90 with 1 refill Follow up time period per chart: N/A  Please advise Dgaddy, CMA

## 2018-04-16 NOTE — Telephone Encounter (Signed)
Patient is requesting a refill of the following medications: Requested Prescriptions   Pending Prescriptions Disp Refills  . lamoTRIgine (LAMICTAL) 150 MG tablet [Pharmacy Med Name: LAMOTRIGINE 150MG  TABLETS] 90 tablet 0    Sig: TAKE 1 TABLET(150 MG) BY MOUTH DAILY    Date of patient request: 04/15/17 Last office visit: 08/19/2017 Date of last refill: 08/19/2017 Last refill amount: 90 with 1 refill Follow up time period per chart: N/A  Please advise Dgaddy, CMA

## 2018-04-18 NOTE — Telephone Encounter (Signed)
Please let the patient know her medication is sent in

## 2018-10-03 ENCOUNTER — Encounter

## 2018-10-11 ENCOUNTER — Other Ambulatory Visit: Payer: Self-pay | Admitting: Family Medicine

## 2018-10-11 DIAGNOSIS — F419 Anxiety disorder, unspecified: Secondary | ICD-10-CM

## 2018-10-11 NOTE — Telephone Encounter (Signed)
Copied from [place] [id number]. Topic: Quick Communication - Rx Refill/Question >> Oct 11, 2018 10:22 AM Scherrie Gerlach wrote: Medication: lamoTRIgine (LAMICTAL) 150 MG tablet  Has the patient contacted their pharmacy? Yes  Pt states the pharmacy advised her this was denied, but I do not see request. [facility] [place], [place] [phone] (Phone) [phone] (Fax)

## 2018-10-13 ENCOUNTER — Other Ambulatory Visit: Payer: Self-pay | Admitting: Family Medicine

## 2018-10-13 ENCOUNTER — Encounter: Payer: Medicare Other | Admitting: Family Medicine

## 2018-10-13 DIAGNOSIS — F419 Anxiety disorder, unspecified: Secondary | ICD-10-CM

## 2018-10-13 MED ORDER — LAMOTRIGINE 150 MG PO TABS: 150.0000 mg | ORAL_TABLET | Freq: Every day | ORAL | 0 refills | Status: DC

## 2018-10-13 NOTE — Telephone Encounter (Signed)
Refill request lamictal 150 mg #30 with 0 refills.   Pt has appt with stallings on 10/28/2018. [place]

## 2018-10-28 ENCOUNTER — Encounter: Payer: Self-pay | Admitting: Family Medicine

## 2018-10-28 ENCOUNTER — Other Ambulatory Visit: Payer: Self-pay

## 2018-10-28 ENCOUNTER — Ambulatory Visit (INDEPENDENT_AMBULATORY_CARE_PROVIDER_SITE_OTHER): Payer: Medicare Other | Admitting: Family Medicine

## 2018-10-28 ENCOUNTER — Other Ambulatory Visit: Payer: Self-pay | Admitting: Family Medicine

## 2018-10-28 VITALS — BP 121/78 | HR 86 | Temp 98.3°F | Resp 17 | Ht 68.27 in | Wt 153.8 lb

## 2018-10-28 DIAGNOSIS — Z0001 Encounter for general adult medical examination with abnormal findings: Secondary | ICD-10-CM

## 2018-10-28 DIAGNOSIS — Z23 Encounter for immunization: Secondary | ICD-10-CM

## 2018-10-28 DIAGNOSIS — M8589 Other specified disorders of bone density and structure, multiple sites: Secondary | ICD-10-CM | POA: Diagnosis not present

## 2018-10-28 DIAGNOSIS — Z136 Encounter for screening for cardiovascular disorders: Secondary | ICD-10-CM

## 2018-10-28 DIAGNOSIS — N951 Menopausal and female climacteric states: Secondary | ICD-10-CM | POA: Diagnosis not present

## 2018-10-28 DIAGNOSIS — Z1239 Encounter for other screening for malignant neoplasm of breast: Secondary | ICD-10-CM

## 2018-10-28 DIAGNOSIS — J302 Other seasonal allergic rhinitis: Secondary | ICD-10-CM

## 2018-10-28 DIAGNOSIS — Z0000 Encounter for general adult medical examination without abnormal findings: Secondary | ICD-10-CM

## 2018-10-28 DIAGNOSIS — F419 Anxiety disorder, unspecified: Secondary | ICD-10-CM

## 2018-10-28 MED ORDER — LAMOTRIGINE 100 MG PO TABS: 100.0000 mg | ORAL_TABLET | Freq: Every day | ORAL | 1 refills | Status: DC

## 2018-10-28 MED ORDER — CETIRIZINE HCL 10 MG PO TABS: 10.0000 mg | ORAL_TABLET | Freq: Every day | ORAL | 11 refills | Status: AC

## 2018-10-28 MED ORDER — VENLAFAXINE HCL ER 37.5 MG PO CP24: ORAL_CAPSULE | ORAL | 0 refills | Status: DC

## 2018-10-28 NOTE — Patient Instructions (Addendum)
LAMOTRIGINE 100 mg tablet for 2 weeks  LAMOTRIGINE 75 mg (1/2 tablet of the 150mg  that you have at home) for 2 weeks  LAMOTRIGINE 50 mg tablet 2 weeks  (1/2 tablet of the 100mg  tablets that you will have left over)   Follow up at 6 weeks to discuss how to do next stage of the weaning from the medication   EXERCISE AND DIET:  We recommended that you start or continue a regular exercise program for good health. Regular exercise means any activity that makes your heart beat faster and makes you sweat.  We recommend exercising at least 30 minutes per day at least 3 days a week, preferably 4 or 5.  We also recommend a diet low in fat and sugar.  Inactivity, poor dietary choices and obesity can cause diabetes, heart attack, stroke, and kidney damage, among others.     ALCOHOL AND SMOKING:  Women should limit their alcohol intake to no more than 7 drinks/beers/glasses of wine (combined, not each!) per week. Moderation of alcohol intake to this level decreases your risk of breast cancer and liver damage. And of course, no recreational drugs are part of a healthy lifestyle.  And absolutely no smoking or even second hand smoke. Most people know smoking can cause heart and lung diseases, but did you know it also contributes to weakening of your bones? Aging of your skin?  Yellowing of your teeth and nails?   CALCIUM AND VITAMIN D:  Adequate intake of calcium and Vitamin D are recommended.  The recommendations for exact amounts of these supplements seem to change often, but generally speaking 1,000 mg of calcium (between diet and supplement) and 800 units of Vitamin D per day seems prudent. Certain women may benefit from higher intake of Vitamin D.  If you are among these women, your doctor will have told you during your visit.       MAMMOGRAMS:  All women over 35 years old should have a yearly mammogram. Many facilities now offer a "3D" mammogram, which may cost around $50 extra out of pocket. If possible,  we  recommend you accept the option to have the 3D mammogram performed.  It both reduces the number of women who will be called back for extra views which then turn out to be normal, and it is better than the routine mammogram at detecting truly abnormal areas.     COLON CANCER SCREENING: Now recommend starting at age 58. At this time colonoscopy is not covered for routine screening until 50. There are take home tests that can be done between 45-49.    COLONOSCOPY:  Colonoscopy to screen for colon cancer is recommended for all women at age 3.  We know, you hate the idea of the prep.  We agree, BUT, having colon cancer and not knowing it is worse!!  Colon cancer so often starts as a polyp that can be seen and removed at colonscopy, which can quite literally save your life!  And if your first colonoscopy is normal and you have no family history of colon cancer, most women don't have to have it again for 10 years.  Once every ten years, you can do something that may end up saving your life, right?  We will be happy to help you get it scheduled when you are ready.  Be sure to check your insurance coverage so you understand how much it will cost.  It may be covered as a preventative service at no cost, but  you should check your particular policy.     If you have lab work done today you will be contacted with your lab results within the next 2 weeks.  If you have not heard from [place] then please contact us. The fastest way to get your results is to register for My Chart.   IF you received an x-ray today, you will receive an invoice from [facility]. Please contact [facility]iology at [phone] with questions or concerns regarding your invoice.   IF you received labwork today, you will receive an invoice from [facility]. Please contact [facility] at [phone] with questions or concerns regarding your invoice.   Our billing staff will not be able to assist you with questions regarding bills from these  companies.  You will be contacted with the lab results as soon as they are available. The fastest way to get your results is to activate your My Chart account. Instructions are located on the last page of this paperwork. If you have not heard from [place] regarding the results in 2 weeks, please contact this office.

## 2018-10-28 NOTE — Progress Notes (Signed)
QUICK REFERENCE INFORMATION: The ABCs of Providing the Annual Wellness Visit  CMS.gov Medicare Learning Network  [employer] Annual Wellness Visit  Subjective:   Rebecca [name] is a 68 y.o. Female who presents for an Annual Wellness Visit.    Patient Active Problem List   Diagnosis Date Noted  . Osteopenia 02/18/2017  . Lichen planus 16/07/9603  . Anxiety disorder 11/05/2011  . Menopause   . Murmur   . Poikiloderma   . Herpes zoster   . Degenerative disk disease   . Tinnitus   . Colon polyp     Past Medical History:  Diagnosis Date  . Anxiety   . Colon polyp    2007  . Degenerative disk disease    C5-C6  . Depression   . Herpes zoster    2011  . Menopause   . Murmur   . Osteopenia   . Poikiloderma   . Tinnitus    L ear     Past Surgical History:  Procedure Laterality Date  . CESAREAN SECTION     2 times  . COLONOSCOPY    . EYE SURGERY Left 10/20/2017  . HEMORRHOID SURGERY    . TONSILLECTOMY     as a child     Outpatient Medications Prior to Visit  Medication Sig Dispense Refill  . lamoTRIgine (LAMICTAL) 150 MG tablet Take 1 tablet (150 mg total) by mouth daily. 30 tablet 0  . cetirizine (ZYRTEC) 10 MG tablet Take 1 tablet (10 mg total) by mouth daily. 30 tablet 11  . venlafaxine XR (EFFEXOR-XR) 37.5 MG 24 hr capsule TAKE 1 CAPSULE BY MOUTH EVERY DAY 90 capsule 1  . tranexamic acid (LYSTEDA) 650 MG TABS tablet Take 650 mg daily by mouth.     No facility-administered medications prior to visit.     Allergies  Allergen Reactions  . Codeine Hives     Family History  Problem Relation Age of Onset  . Cancer Brother   . Diabetes Maternal Grandmother   . Hearing loss Maternal Grandmother      Social History   Socioeconomic History  . Marital status: Married    Spouse name: Herbie [place]  . Number of children: 2  . Years of education: Not on file  . Highest education level: Some college, no degree  Occupational History  . Occupation: Materials engineer: [employer]  . Financial resource strain: Not hard at all  . Food insecurity:    Worry: Never true    Inability: Never true  . Transportation needs:    Medical: No    Non-medical: No  Tobacco Use  . Smoking status: Former Smoker    Packs/day: 1.00    Types: Cigarettes    Last attempt to quit: 11/04/1978    Years since quitting: 40.0  . Smokeless tobacco: Never Used  Substance and Sexual Activity  . Alcohol use: Yes    Alcohol/week: 7.0 standard drinks    Types: 7 Standard drinks or equivalent per week    Comment:  usually 1 drink nightly  . Drug use: No  . Sexual activity: Yes  Lifestyle  . Physical activity:    Days per week: 0 days    Minutes per session: 0 min  . Stress: Not at all  Relationships  . Social connections:    Talks on phone: More than three times a week    Gets together: Never    Attends religious service: Never  Active member of club or organization: Yes    Attends meetings of clubs or organizations: More than 4 times per year    Relationship status: Married  Other Topics Concern  . Not on file  Social History Narrative  . Not on file      Recent Hospitalizations? No  Health Habits: Current exercise activities include: none Exercise: 0 times/week. Diet: in general, a "healthy" diet    Alcohol intake: none  Health Risk Assessment: The patient has completed a Health Risk Assessment. This has been reveiwed with them and has been scanned into the [facility] system as an attached document.  Current Medical Providers and Suppliers: [facility] Patient Care Team: Forrest Moron, MD as PCP - General (Internal Medicine) Ralene Bathe, MD as Consulting Physician (Ophthalmology) Loney Loh, MD (Dermatology) Future Appointments  Date Time Provider [place]  12/13/2018  9:20 AM Forrest Moron, MD PCP-PCP [facility]     Age-appropriate Screening Schedule: The list below includes current immunization  status and future screening recommendations based on patient's age. Orders for these recommended tests are listed in the plan section. The patient has been provided with a written plan. Immunization History  Administered Date(s) Administered  . DTaP 09/23/2009  . Hepatitis A 09/23/2009, 08/21/2010  . Influenza, High Dose Seasonal PF 08/23/2018  . Influenza,inj,Quad PF,6+ Mos 07/27/2013, 06/05/2016, 08/19/2017  . Influenza-Unspecified 06/05/2017  . Pneumococcal Conjugate-13 03/16/2017  . Pneumococcal Polysaccharide-23 10/28/2018  . Td 10/28/2018  . Zoster 03/06/2011  . Zoster Recombinat (Shingrix) 10/26/2018    Health Maintenance reviewed -  mammogram ordered, patient to schedule appointment, tetanus booster given, Pneumovax given   Depression Screen-PHQ2/9 completed today  Depression screen [facility] 2/9 10/28/2018 10/25/2017 08/19/2017 08/19/2017 07/17/2017  Decreased Interest 0 0 0 0 0  Down, Depressed, Hopeless 0 0 0 0 0  PHQ - 2 Score 0 0 0 0 0       Depression Severity and Treatment Recommendations:  0-4= None  5-9= Mild / Treatment: Support, educate to call if worse; return in one month  10-14= Moderate / Treatment: Support, watchful waiting; Antidepressant or Psycotherapy  15-19= Moderately severe / Treatment: Antidepressant OR Psychotherapy  >= 20 = Major depression, severe / Antidepressant AND Psychotherapy  Functional Status Survey:   Is the patient deaf or have difficulty hearing?: No Does the patient have difficulty seeing, even when wearing glasses/contacts?: No Does the patient have difficulty concentrating, remembering, or making decisions?: No Does the patient have difficulty walking or climbing stairs?: No Does the patient have difficulty dressing or bathing?: No Does the patient have difficulty doing errands alone such as visiting a doctor's office or shopping?: No   Cognitive Assessment: Does the patient have evidence of cognitive impairment? No The patient  does not have any evidence of any cognitive problems and denies any  change in mood/affect, appearance, speech, memory or motor skills.  Identification of Risk Factors: Risk factors include: none  ROS Review of Systems  Constitutional: Negative for activity change, appetite change, chills and fever.  HENT: Negative for congestion, nosebleeds, trouble swallowing and voice change.   Respiratory: Negative for cough, shortness of breath and wheezing.   Gastrointestinal: Negative for diarrhea, nausea and vomiting.  Genitourinary: Negative for difficulty urinating, dysuria, flank pain and hematuria.  Musculoskeletal: Negative for back pain, joint swelling and neck pain.  Neurological: Negative for dizziness, speech difficulty, light-headedness and numbness.  See HPI. All other review of systems negative.   Objective:   Vitals:   10/28/18  0819  BP: 121/78  Pulse: 86  Resp: 17  Temp: 98.3 F (36.8 C)  TempSrc: Oral  SpO2: 100%  Weight: 153 lb 12.8 oz (69.8 kg)  Height: 5' 8.27" (1.734 m)    Body mass index is 23.2 kg/m.  Physical Exam  Constitutional: Oriented to person, place, and time. Appears well-developed and well-nourished.  HENT:  Head: Normocephalic and atraumatic.  Eyes: Conjunctivae and EOM are normal.  Cardiovascular: Normal rate, regular rhythm, normal heart sounds and intact distal pulses.  No murmur heard. Breast exam: symmetric without masses, no palpable axillary lymph nodes Pulmonary/Chest: Effort normal and breath sounds normal. No stridor. No respiratory distress. Has no wheezes.  Neurological: Is alert and oriented to person, place, and time.  Skin: Skin is warm. Capillary refill takes less than 2 seconds.  Psychiatric: Has a normal mood and affect. Behavior is normal. Judgment and thought content normal.     Assessment/Plan:   Patient Self-Management and Personalized Health Advice The patient has been provided with information about:  begin progressive  daily aerobic exercise program and use calcium 1 gram daily with Vit D  During the course of the visit the patient was educated and counseled about appropriate screening and preventive services including:   return annually or prn     Body mass index is 23.2 kg/m. Discussed the patient's BMI with her. The BMI BMI is in the acceptable range  Ilana was seen today for annual exam and medication refill.  Diagnoses and all orders for this visit:  Encounter for Medicare annual wellness exam  Need for vaccination -     Td : Tetanus/diphtheria >7yo Preservative  free -     Pneumococcal polysaccharide vaccine 23-valent greater than or equal to 2yo subcutaneous/IM  Osteopenia of multiple sites -     Lipid panel -     DG Bone Density; Future -     CMP14+EGFR  Menopausal state -     Lipid panel -     DG Bone Density; Future  Screening for breast cancer -     MM Digital Screening; Future  Encounter for screening for cardiovascular disorders -     Lipid panel  Seasonal allergies -     cetirizine (ZYRTEC) 10 MG tablet; Take 1 tablet (10 mg total) by mouth daily.  Anxiety disorder, unspecified type-- pt ready to taper down off meds Will wean off lamotrigine in a step wise fashion  Lamotrigine 132m x 2 weeks, 771mx 2 weeks, then 5044m 2 weeks Pt to follow up in six weeks to discuss progress  -     venlafaxine XR (EFFEXOR-XR) 37.5 MG 24 hr capsule; TAKE 1 CAPSULE BY MOUTH EVERY DAY  Other orders -     lamoTRIgine (LAMICTAL) 100 MG tablet; Take 1 tablet (100 mg total) by mouth daily for 30 days.      Return in about 6 weeks (around 12/09/2018) for medication adjustment, weaning off meds.  Future Appointments  Date Time Provider [place]/07/2019  9:20 AM StaForrest MoronD PCP-PCP [facility]    Patient Instructions   LAMOTRIGINE 100 mg tablet for 2 weeks  LAMOTRIGINE 75 mg (1/2 tablet of the 150m43mat you have at home) for 2 weeks  LAMOTRIGINE 50 mg tablet 2 weeks   (1/2 tablet of the 100mg40mlets that you will have left over)   Follow up at 6 weeks to discuss how to do next stage of the weaning from the medication  EXERCISE AND DIET:  We recommended that you start or continue a regular exercise program for good health. Regular exercise means any activity that makes your heart beat faster and makes you sweat.  We recommend exercising at least 30 minutes per day at least 3 days a week, preferably 4 or 5.  We also recommend a diet low in fat and sugar.  Inactivity, poor dietary choices and obesity can cause diabetes, heart attack, stroke, and kidney damage, among others.     ALCOHOL AND SMOKING:  Women should limit their alcohol intake to no more than 7 drinks/beers/glasses of wine (combined, not each!) per week. Moderation of alcohol intake to this level decreases your risk of breast cancer and liver damage. And of course, no recreational drugs are part of a healthy lifestyle.  And absolutely no smoking or even second hand smoke. Most people know smoking can cause heart and lung diseases, but did you know it also contributes to weakening of your bones? Aging of your skin?  Yellowing of your teeth and nails?   CALCIUM AND VITAMIN D:  Adequate intake of calcium and Vitamin D are recommended.  The recommendations for exact amounts of these supplements seem to change often, but generally speaking 1,000 mg of calcium (between diet and supplement) and 800 units of Vitamin D per day seems prudent. Certain women may benefit from higher intake of Vitamin D.  If you are among these women, your doctor will have told you during your visit.       MAMMOGRAMS:  All women over 10 years old should have a yearly mammogram. Many facilities now offer a "3D" mammogram, which may cost around $50 extra out of pocket. If possible,  we recommend you accept the option to have the 3D mammogram performed.  It both reduces the number of women who will be called back for extra views which then  turn out to be normal, and it is better than the routine mammogram at detecting truly abnormal areas.     COLON CANCER SCREENING: Now recommend starting at age 49. At this time colonoscopy is not covered for routine screening until 50. There are take home tests that can be done between 45-49.    COLONOSCOPY:  Colonoscopy to screen for colon cancer is recommended for all women at age 85.  We know, you hate the idea of the prep.  We agree, BUT, having colon cancer and not knowing it is worse!!  Colon cancer so often starts as a polyp that can be seen and removed at colonscopy, which can quite literally save your life!  And if your first colonoscopy is normal and you have no family history of colon cancer, most women don't have to have it again for 10 years.  Once every ten years, you can do something that may end up saving your life, right?  We will be happy to help you get it scheduled when you are ready.  Be sure to check your insurance coverage so you understand how much it will cost.  It may be covered as a preventative service at no cost, but you should check your particular policy.     If you have lab work done today you will be contacted with your lab results within the next 2 weeks.  If you have not heard from [place] then please contact us. The fastest way to get your results is to register for My Chart.   IF you received an x-ray today, you will receive  an Pharmacologist from [facility]. Please contact [facility] at [phone] with questions or concerns regarding your invoice.   IF you received labwork today, you will receive an invoice from [facility]. Please contact [facility] at [phone] with questions or concerns regarding your invoice.   Our billing staff will not be able to assist you with questions regarding bills from these companies.  You will be contacted with the lab results as soon as they are available. The fastest way to get your results is to activate your My Chart  account. Instructions are located on the last page of this paperwork. If you have not heard from [place] regarding the results in 2 weeks, please contact this office.       An after visit summary with all of these plans was given to the patient.

## 2018-10-29 LAB — LIPID PANEL
CHOLESTEROL TOTAL: 207 mg/dL — AB (ref 100–199)
Chol/HDL Ratio: 2.2 ratio (ref 0.0–4.4)
HDL: 94 mg/dL (ref >39)
LDL Calculated: 103 mg/dL — ABNORMAL HIGH (ref 0–99)
Triglycerides: 49 mg/dL (ref 0–149)
VLDL Cholesterol Cal: 10 mg/dL (ref 5–40)

## 2018-10-29 LAB — CMP14+EGFR
ALBUMIN: 4.4 g/dL (ref 3.8–4.8)
ALT: 14 IU/L (ref 0–32)
AST: 16 IU/L (ref 0–40)
Albumin/Globulin Ratio: 2 (ref 1.2–2.2)
Alkaline Phosphatase: 65 IU/L (ref 39–117)
BUN/Creatinine Ratio: 12 (ref 12–28)
BUN: 12 mg/dL (ref 8–27)
Bilirubin Total: 0.5 mg/dL (ref 0.0–1.2)
CO2: 25 mmol/L (ref 20–29)
Calcium: 9.7 mg/dL (ref 8.7–10.3)
Chloride: 105 mmol/L (ref 96–106)
Creatinine, Ser: 0.97 mg/dL (ref 0.57–1.00)
GFR calc Af Amer: 70 mL/min/{1.73_m2} (ref >59)
GFR calc non Af Amer: 61 mL/min/{1.73_m2} (ref >59)
GLUCOSE: 87 mg/dL (ref 65–99)
Globulin, Total: 2.2 g/dL (ref 1.5–4.5)
Potassium: 4.4 mmol/L (ref 3.5–5.2)
Sodium: 143 mmol/L (ref 134–144)
Total Protein: 6.6 g/dL (ref 6.0–8.5)

## 2018-10-31 ENCOUNTER — Ambulatory Visit: Payer: Self-pay | Admitting: *Deleted

## 2018-10-31 NOTE — Telephone Encounter (Signed)
Please advise 

## 2018-10-31 NOTE — Telephone Encounter (Signed)
Pt. called to report sudden onset of nausea, with vomiting x 1, headache, body aches, and chills on Friday, 1/24, approx. 4 hrs. after receiving the Pneumonia and Tetanus vaccines.  Stated the body aches have improved and chills have subsided.  Continues to c/o headache; rated at 4-5/10 at this time.  Denied any further nausea and vomiting.  Questioned if she has reacted to the Pneumonia and Tetanus vaccines.  Reported some redness and soreness at and just below the injection sites.  Denied any upper resp. symptoms.  Advised the headache, body aches, fever, and local reaction can occur, following the immunizations, and should resolve after 3-4 days.  Advised to continue to rest, hydrate, and take Tylenol for aches/ pains/ fever, prn.  Encouraged to call is symptoms don't resolve.  Discussed that her symptoms could also be related to a virus.  Pt. Verb. Understanding.  Reported that due to the sudden onset of her symptoms on Friday and Saturday, she was not able to keep reservations at a hotel in [place].  Is requesting a letter from Dr. Nolon Rod that will give medical justification for cancelling the hotel reservation, so she will not be charged for the room.  Advised will send message to Dr. Nolon Rod with this request, and to expect a return call.      Reason for Disposition . Pneumococcal vaccine reactions . Tetanus-diphtheria (Td) vaccine reactions  Answer Assessment - Initial Assessment Questions 1. SYMPTOMS: "What is the main symptom?" (e.g., redness, swelling, pain)      C/o headache, body aches and chills 2. ONSET: "When was the vaccine (shot) given?" "How much later did the symptoms__ begin?" (e.g., hours, days ago)      4 hours 3. SEVERITY: "How bad is it?"      Headache persists; has been 8/10; now 4-5/ 10  4. FEVER: "Is there a fever?" If so, ask: "What is it, how was it measured, and when did it start?"      Chills intermittent on 1st and 2nd day 5. IMMUNIZATIONS GIVEN: "What shots  have you recently received?"     Pneumonia shot and tetanus shot 6. PAST REACTIONS: "Have you reacted to immunizations before?" If so, ask: "What happened?"     denied 7. OTHER SYMPTOMS: "Do you have any other symptoms?"     Body aches ; some soreness at the injection site.  Protocols used: IMMUNIZATION REACTIONS-A-AH  Message from Conception Chancy, NT sent at 10/31/2018 9:09 AM EST   Patient is calling and states she had the pneumonia vaccine and tetanus shot on 10/28/18 and has had a headache, body aches, and chills. She is unsure if this is related or not.

## 2018-11-01 ENCOUNTER — Ambulatory Visit: Payer: Self-pay

## 2018-11-01 NOTE — Telephone Encounter (Signed)
Pt. Asking for an answer on her request yesterday. No availability in office today. States she "feels a little better, but still has a headache." Requesting assistance with a letter and would like something for her headache.Please advise.  Reason for Disposition . [1] MODERATE headache (e.g., interferes with normal activities) AND [2] present > 24 hours AND [3] unexplained  (Exceptions: analgesics not tried, typical migraine, or headache part of viral illness)  Answer Assessment - Initial Assessment Questions 1. LOCATION: "Where does it hurt?"      Hurts base of neck and left top 2. ONSET: "When did the headache start?" (Minutes, hours or days)      Started Friday 3. PATTERN: "Does the pain come and go, or has it been constant since it started?"     Comes and goes 4. SEVERITY: "How bad is the pain?" and "What does it keep you from doing?"  (e.g., Scale 1-10; mild, moderate, or severe)   - MILD (1-3): doesn't interfere with normal activities    - MODERATE (4-7): interferes with normal activities or awakens from sleep    - SEVERE (8-10): excruciating pain, unable to do any normal activities        3 dull ache 5. RECURRENT SYMPTOM: "Have you ever had headaches before?" If so, ask: "When was the last time?" and "What happened that time?"      No 6. CAUSE: "What do you think is causing the headache?"     Unsure 7. MIGRAINE: "Have you been diagnosed with migraine headaches?" If so, ask: "Is this headache similar?"      No 8. HEAD INJURY: "Has there been any recent injury to the head?"      No 9. OTHER SYMPTOMS: "Do you have any other symptoms?" (fever, stiff neck, eye pain, sore throat, cold symptoms)     No 10. PREGNANCY: "Is there any chance you are pregnant?" "When was your last menstrual period?"       No  Protocols used: HEADACHE-A-AH

## 2018-11-02 ENCOUNTER — Telehealth: Payer: Self-pay | Admitting: *Deleted

## 2018-11-02 ENCOUNTER — Encounter: Payer: Self-pay | Admitting: *Deleted

## 2018-11-03 ENCOUNTER — Encounter: Payer: Self-pay | Admitting: *Deleted

## 2018-11-04 NOTE — Telephone Encounter (Signed)
Patient advised letter ready for pick up

## 2018-12-13 ENCOUNTER — Other Ambulatory Visit: Payer: Self-pay

## 2018-12-13 ENCOUNTER — Ambulatory Visit: Payer: Medicare Other | Admitting: Family Medicine

## 2018-12-13 ENCOUNTER — Encounter: Payer: Self-pay | Admitting: Family Medicine

## 2018-12-13 VITALS — BP 128/80 | HR 86 | Temp 98.3°F | Ht 68.0 in | Wt 156.2 lb

## 2018-12-13 DIAGNOSIS — F411 Generalized anxiety disorder: Secondary | ICD-10-CM

## 2018-12-13 NOTE — Patient Instructions (Addendum)
  Since you have weaned off Lamictal safely and are interested in weaning off the Effexor  Take the Effexor 37.5mg  every other day for 2 weeks then stop all together     If you have lab work done today you will be contacted with your lab results within the next 2 weeks.  If you have not heard from [place] then please contact us. The fastest way to get your results is to register for My Chart.   IF you received an x-ray today, you will receive an invoice from [facility]. Please contact [facility] at [phone] with questions or concerns regarding your invoice.   IF you received labwork today, you will receive an invoice from [facility]. Please contact [facility] at [phone] with questions or concerns regarding your invoice.   Our billing staff will not be able to assist you with questions regarding bills from these companies.  You will be contacted with the lab results as soon as they are available. The fastest way to get your results is to activate your My Chart account. Instructions are located on the last page of this paperwork. If you have not heard from [place] regarding the results in 2 weeks, please contact this office.

## 2018-12-13 NOTE — Progress Notes (Signed)
Established Patient Office Visit  Subjective:  Patient ID: Rebecca [name], female    DOB: [birth year]  Age: 68 y.o. MRN: [id number]  CC:  Chief Complaint  Patient presents with  . medication adjustment    effexor and lamictal     HPI Rebecca [name] presents for   Patient has weaned over the last six week from Lamictal and is now having less sweating Her mood has been stable.  She is wondering if the symptoms of sweat is not also due to Effexor-xr 37.5mg  She would like to continue to wean off the Effexor-XR  She reports that she has tablets that are timed release  She states that she would like get off the effexor.   Depression screen [facility] 2/9 12/13/2018 10/28/2018 10/25/2017 08/19/2017 08/19/2017  Decreased Interest 0 0 0 0 0  Down, Depressed, Hopeless 0 0 0 0 0  PHQ - 2 Score 0 0 0 0 0     Past Medical History:  Diagnosis Date  . Anxiety   . Colon polyp    2007  . Degenerative disk disease    C5-C6  . Depression   . Herpes zoster    2011  . Menopause   . Murmur   . Osteopenia   . Poikiloderma   . Tinnitus    L ear    Past Surgical History:  Procedure Laterality Date  . CESAREAN SECTION     2 times  . COLONOSCOPY    . EYE SURGERY Left 10/20/2017  . HEMORRHOID SURGERY    . TONSILLECTOMY     as a child    Family History  Problem Relation Age of Onset  . Cancer Brother   . Diabetes Maternal Grandmother   . Hearing loss Maternal Grandmother     Social History   Socioeconomic History  . Marital status: Married    Spouse name: Herbie [place]  . Number of children: 2  . Years of education: Not on file  . Highest education level: Some college, no degree  Occupational History  . Occupation: Art therapist: [employer]  . Financial resource strain: Not hard at all  . Food insecurity:    Worry: Never true    Inability: Never true  . Transportation needs:    Medical: No    Non-medical: No  Tobacco Use  . Smoking status: Former  Smoker    Packs/day: 1.00    Types: Cigarettes    Last attempt to quit: 11/04/1978    Years since quitting: 40.1  . Smokeless tobacco: Never Used  Substance and Sexual Activity  . Alcohol use: Yes    Alcohol/week: 7.0 standard drinks    Types: 7 Standard drinks or equivalent per week    Comment:  usually 1 drink nightly  . Drug use: No  . Sexual activity: Yes  Lifestyle  . Physical activity:    Days per week: 0 days    Minutes per session: 0 min  . Stress: Not at all  Relationships  . Social connections:    Talks on phone: More than three times a week    Gets together: Never    Attends religious service: Never    Active member of club or organization: Yes    Attends meetings of clubs or organizations: More than 4 times per year    Relationship status: Married  . Intimate partner violence:    Fear of current or ex partner: No    Emotionally  abused: No    Physically abused: No    Forced sexual activity: No  Other Topics Concern  . Not on file  Social History Narrative  . Not on file    Outpatient Medications Prior to Visit  Medication Sig Dispense Refill  . cetirizine (ZYRTEC) 10 MG tablet Take 1 tablet (10 mg total) by mouth daily. 30 tablet 11  . venlafaxine XR (EFFEXOR-XR) 37.5 MG 24 hr capsule TAKE 1 CAPSULE BY MOUTH EVERY DAY 90 capsule 0  . lamoTRIgine (LAMICTAL) 150 MG tablet Take 1 tablet (150 mg total) by mouth daily. (Patient not taking: Reported on 12/13/2018) 30 tablet 0  . lamoTRIgine (LAMICTAL) 100 MG tablet Take 1 tablet (100 mg total) by mouth daily for 30 days. 30 tablet 1   No facility-administered medications prior to visit.     Allergies  Allergen Reactions  . Codeine Hives  . Pnu-Imune [Pneumococcal Polysaccharide Vaccine]     Pneumococcal 23     Fever/Chills/Vomiting/Headache/Redness injection site/Swelling    ROS Review of Systems Review of Systems  Constitutional: Negative for activity change, appetite change, chills and fever.  HENT:  Negative for congestion, nosebleeds, trouble swallowing and voice change.   Respiratory: Negative for cough, shortness of breath and wheezing.   Gastrointestinal: Negative for diarrhea, nausea and vomiting.  Genitourinary: Negative for difficulty urinating, dysuria, flank pain and hematuria.  Musculoskeletal: Negative for back pain, joint swelling and neck pain.  Neurological: Negative for dizziness, speech difficulty, light-headedness and numbness.  See HPI. All other review of systems negative.     Objective:    Physical Exam  BP 128/80 (BP Location: Right Arm, Patient Position: Sitting, Cuff Size: Normal)   Pulse 86   Temp 98.3 F (36.8 C) (Oral)   Ht 5\' 8"  (1.727 m)   Wt 156 lb 3.2 oz (70.9 kg)   SpO2 99%   BMI 23.75 kg/m  Wt Readings from Last 3 Encounters:  12/13/18 156 lb 3.2 oz (70.9 kg)  10/28/18 153 lb 12.8 oz (69.8 kg)  10/25/17 155 lb 9.6 oz (70.6 kg)   Physical Exam  Constitutional: Oriented to person, place, and time. Appears well-developed and well-nourished.  HENT:  Head: Normocephalic and atraumatic.  Eyes: Conjunctivae and EOM are normal.  Cardiovascular: Normal rate, regular rhythm, normal heart sounds and intact distal pulses.  No murmur heard. Pulmonary/Chest: Effort normal and breath sounds normal. No stridor. No respiratory distress. Has no wheezes.  Neurological: Is alert and oriented to person, place, and time.  Skin: Skin is warm. Capillary refill takes less than 2 seconds.  Psychiatric: Has a normal mood and affect. Behavior is normal. Judgment and thought content normal.    There are no preventive care reminders to display for this patient.  There are no preventive care reminders to display for this patient.  Lab Results  Component Value Date   TSH 2.571 11/15/2013   Lab Results  Component Value Date   WBC 3.3 (L) 08/19/2017   HGB 13.4 08/19/2017   HCT 39.8 08/19/2017   MCV 95 08/19/2017   PLT 246 08/19/2017   Lab Results  Component  Value Date   NA 143 10/28/2018   K 4.4 10/28/2018   CO2 25 10/28/2018   GLUCOSE 87 10/28/2018   BUN 12 10/28/2018   CREATININE 0.97 10/28/2018   BILITOT 0.5 10/28/2018   ALKPHOS 65 10/28/2018   AST 16 10/28/2018   ALT 14 10/28/2018   PROT 6.6 10/28/2018   ALBUMIN 4.4 10/28/2018  CALCIUM 9.7 10/28/2018   Lab Results  Component Value Date   CHOL 207 (H) 10/28/2018   Lab Results  Component Value Date   HDL 94 10/28/2018   Lab Results  Component Value Date   LDLCALC 103 (H) 10/28/2018   Lab Results  Component Value Date   TRIG 49 10/28/2018   Lab Results  Component Value Date   CHOLHDL 2.2 10/28/2018   No results found for: HGBA1C    Assessment & Plan:   Problem List Items Addressed This Visit      Other   Anxiety disorder - Primary     Discussed weaning off Effexor  Take every other day Discussed serotonin withdrawal symptoms   No orders of the defined types were placed in this encounter.   Follow-up: No follow-ups on file.    Forrest Moron, MD

## 2019-01-24 ENCOUNTER — Other Ambulatory Visit: Payer: Self-pay | Admitting: Family Medicine

## 2019-01-24 DIAGNOSIS — F419 Anxiety disorder, unspecified: Secondary | ICD-10-CM

## 2019-01-24 NOTE — Telephone Encounter (Signed)
Please Advise  Patient is requesting a refill of the following medications: Requested Prescriptions   Pending Prescriptions Disp Refills  . venlafaxine XR (EFFEXOR-XR) 37.5 MG 24 hr capsule 90 capsule 0    Sig: TAKE 1 CAPSULE BY MOUTH EVERY DAY    Date of patient request: 01/24/19 Last office visit: 12/13/18 Date of last refill: 10/28/18 Last refill amount: 90 Follow up time period per chart: n/a

## 2019-01-31 MED ORDER — VENLAFAXINE HCL ER 37.5 MG PO CP24: ORAL_CAPSULE | ORAL | 3 refills | Status: AC

## 2019-03-21 ENCOUNTER — Emergency Department (HOSPITAL_BASED_OUTPATIENT_CLINIC_OR_DEPARTMENT_OTHER)
Admission: EM | Admit: 2019-03-21 | Discharge: 2019-03-21 | Disposition: A | Discharge status: Home / Self Care | Payer: Medicare Other | Attending: Emergency Medicine | Admitting: Emergency Medicine

## 2019-03-21 ENCOUNTER — Emergency Department (HOSPITAL_BASED_OUTPATIENT_CLINIC_OR_DEPARTMENT_OTHER): Payer: Medicare Other

## 2019-03-21 ENCOUNTER — Encounter (HOSPITAL_BASED_OUTPATIENT_CLINIC_OR_DEPARTMENT_OTHER): Payer: Self-pay | Admitting: Emergency Medicine

## 2019-03-21 ENCOUNTER — Other Ambulatory Visit: Payer: Self-pay

## 2019-03-21 DIAGNOSIS — S4992XA Unspecified injury of left shoulder and upper arm, initial encounter: Secondary | ICD-10-CM | POA: Diagnosis not present

## 2019-03-21 DIAGNOSIS — Y9301 Activity, walking, marching and hiking: Secondary | ICD-10-CM | POA: Diagnosis not present

## 2019-03-21 DIAGNOSIS — M25512 Pain in left shoulder: Secondary | ICD-10-CM

## 2019-03-21 DIAGNOSIS — Y9289 Other specified places as the place of occurrence of the external cause: Secondary | ICD-10-CM | POA: Diagnosis not present

## 2019-03-21 DIAGNOSIS — Z87891 Personal history of nicotine dependence: Secondary | ICD-10-CM | POA: Diagnosis not present

## 2019-03-21 DIAGNOSIS — Y998 Other external cause status: Secondary | ICD-10-CM | POA: Insufficient documentation

## 2019-03-21 DIAGNOSIS — W010XXA Fall on same level from slipping, tripping and stumbling without subsequent striking against object, initial encounter: Secondary | ICD-10-CM | POA: Diagnosis not present

## 2019-03-21 MED ORDER — HYDROCODONE-ACETAMINOPHEN 5-325 MG PO TABS: 1.0000 | ORAL_TABLET | Freq: Three times a day (TID) | ORAL | 0 refills | Status: DC | PRN

## 2019-03-21 MED ORDER — HYDROCODONE-ACETAMINOPHEN 5-325 MG PO TABS: 1.0000 | ORAL_TABLET | Freq: Three times a day (TID) | ORAL | 0 refills | Status: AC | PRN

## 2019-03-21 NOTE — Discharge Instructions (Addendum)
You can take Tylenol or Ibuprofen as directed for pain. You can alternate Tylenol and Ibuprofen every 4 hours. If you take Tylenol at 1pm, then you can take Ibuprofen at 5pm. Then you can take Tylenol again at 9pm.   Take pain medications as directed for break through pain. Do not drive or operate machinery while taking this medication.   As we discussed, your x-rays did not show any broken bones.  You are point tender over [facility] joint which can indicate a sprain of the [facility] joint.  Please follow-up with orthopedic referral.   Wear sling for support and stabilization.  Follow-up with referred orthopedic doctor as directed.  Return to the emergency department for any worsening concerning symptoms.

## 2019-03-21 NOTE — ED Triage Notes (Signed)
Tripped and fell on uneven pavement 8 days ago.  Injury to left shoulder and the pain is no better.

## 2019-03-21 NOTE — ED Provider Notes (Signed)
[facility] EMERGENCY DEPARTMENT Provider Note   CSN: [id number] Arrival date & time: 03/21/19  1739    History   Chief Complaint Chief Complaint  Patient presents with  . Shoulder Pain    HPI Rebecca [name] is a 68 y.o. female who presents for evaluation of left shoulder pain x1 week after mechanical fall.  She reports that about 8 days ago, she was walking around in her neighborhood and states that she tripped over some uneven pavement, causing her to land on her left shoulder and left hip.  She did not hit her head and denies any head injury or LOC.  Patient states that since then, she has had pain to her left shoulder.  She reports that pain is worse with movement.  She has tried taking ibuprofen, naproxen and muscle relaxers with minimal improvement in symptoms.  She states she has been able to ambulate without any difficulty and denies any pain in her hips.  Patient denies any numbness/weakness.     The history is provided by the patient.    Past Medical History:  Diagnosis Date  . Anxiety   . Colon polyp    2007  . Degenerative disk disease    C5-C6  . Depression   . Herpes zoster    2011  . Menopause   . Murmur   . Osteopenia   . Poikiloderma   . Tinnitus    L ear    Patient Active Problem List   Diagnosis Date Noted  . Osteopenia 02/18/2017  . Lichen planus 24/23/5361  . Anxiety disorder 11/05/2011  . Menopause   . Murmur   . Poikiloderma   . Herpes zoster   . Degenerative disk disease   . Tinnitus   . Colon polyp     Past Surgical History:  Procedure Laterality Date  . CESAREAN SECTION     2 times  . COLONOSCOPY    . EYE SURGERY Left 10/20/2017  . HEMORRHOID SURGERY    . TONSILLECTOMY     as a child     OB History   No obstetric history on file.      Home Medications    Prior to Admission medications   Medication Sig Start Date End Date Taking? Authorizing Provider  fluocinonide (LIDEX) 0.05 % external solution Apply to scalp  three times per week.  Do not apply to face, armpit or groin. 03/21/19 03/14/20 Yes [provider]  cetirizine (ZYRTEC) 10 MG tablet Take 1 tablet (10 mg total) by mouth daily. 10/28/18   Forrest Moron, MD  HYDROcodone-acetaminophen (NORCO/VICODIN) 5-325 MG tablet Take 1-2 tablets by mouth every 8 (eight) hours as needed. 03/21/19   Volanda Napoleon, PA-C  venlafaxine XR (EFFEXOR-XR) 37.5 MG 24 hr capsule TAKE 1 CAPSULE BY MOUTH EVERY DAY 01/31/19   Forrest Moron, MD    Family History Family History  Problem Relation Age of Onset  . Cancer Brother   . Diabetes Maternal Grandmother   . Hearing loss Maternal Grandmother     Social History Social History   Tobacco Use  . Smoking status: Former Smoker    Packs/day: 0.00    Quit date: 11/04/1978    Years since quitting: 40.4  . Smokeless tobacco: Never Used  Substance Use Topics  . Alcohol use: Yes    Alcohol/week: 7.0 standard drinks    Types: 7 Standard drinks or equivalent per week    Comment:  usually 1 drink nightly  .  Drug use: No     Allergies   Codeine and Pnu-imune [pneumococcal polysaccharide vaccine]   Review of Systems Review of Systems  Musculoskeletal:       Left shoulder pain  Neurological: Negative for weakness and numbness.  All other systems reviewed and are negative.    Physical Exam Updated Vital Signs BP (!) 149/90 (BP Location: Right Arm)   Pulse 65   Temp 98.2 F (36.8 C) (Oral)   Resp 18   Ht 5\' 8"  (1.727 m)   Wt 68 kg   SpO2 100%   BMI 22.81 kg/m   Physical Exam Vitals signs and nursing note reviewed.  Constitutional:      Appearance: She is well-developed.  HENT:     Head: Normocephalic and atraumatic.  Eyes:     General: No scleral icterus.       Right eye: No discharge.        Left eye: No discharge.     Conjunctiva/sclera: Conjunctivae normal.  Cardiovascular:     Pulses:          Radial pulses are 2+ on the right side and 2+ on the left side.  Pulmonary:      Effort: Pulmonary effort is normal.  Musculoskeletal:     Comments: Tenderness palpation noted left shoulder.  Tenderness noted to the proximal one third of the clavicle with noted gap at the [facility] joint..  No tenting of skin.  Limited flexion secondary to pain of left shoulder but she is able to achieve about 110 degrees before pain.  No bony tenderness noted to elbow, wrist.  No tenderness palpation noted to right upper extremity.  No pelvic instability.  Skin:    General: Skin is warm and dry.     Capillary Refill: Capillary refill takes less than 2 seconds.     Comments: Good distal cap refill. LUE is not dusky in appearance or cool to touch.  Neurological:     Mental Status: She is alert.     Comments: Sensation intact along major nerve distributions of BUE  Psychiatric:        Speech: Speech normal.        Behavior: Behavior normal.      ED Treatments / Results  Labs (all labs ordered are listed, but only abnormal results are displayed) Labs Reviewed - No data to display  EKG None  Radiology Dg Clavicle Left  Result Date: 03/21/2019 CLINICAL DATA:  Trip and fall on uneven pavement with left clavicle and shoulder pain. EXAM: LEFT CLAVICLE - 2+ VIEWS COMPARISON:  None. FINDINGS: There is no evidence of fracture or other focal bone lesions. Trace degenerative spurring at the acromioclavicular joint. Soft tissues are unremarkable. IMPRESSION: No acute fracture of the left clavicle. Electronically Signed   By: Keith Rake M.D.   On: 03/21/2019 19:24   Dg Shoulder Left  Result Date: 03/21/2019 CLINICAL DATA:  Trip and fall on uneven pavement with left clavicle and shoulder pain. EXAM: LEFT SHOULDER - 2+ VIEW COMPARISON:  None. FINDINGS: There is no evidence of fracture or dislocation. Trace degenerative acromioclavicular and glenohumeral spurring. There is no evidence of arthropathy or other focal bone abnormality. Soft tissues are unremarkable. IMPRESSION: No acute fracture or  subluxation of the left shoulder. Minor degenerative change. Electronically Signed   By: Keith Rake M.D.   On: 03/21/2019 19:24    Procedures Procedures (including critical care time)  Medications Ordered in ED Medications - No data to display  Initial Impression / Assessment and Plan / ED Course  I have reviewed the triage vital signs and the nursing notes.  Pertinent labs & imaging results that were available during my care of the patient were reviewed by me and considered in my medical decision making (see chart for details).        68 year old female who presents for evaluation of mechanical fall that occurred approximately 8 days ago.  Patient reports that she tripped over uneven sidewalk.  No preceding chest pain or dizziness.  Since then has had pain to the right shoulder and clavicle area.  She has been taking over-the-counter medications with no improvement.  Denies any head injury, LOC. Patient is afebrile, non-toxic appearing, sitting comfortably on examination table. Vital signs reviewed and stable.  Patient is neurovascularly intact.  Patient with tenderness palpation to the left shoulder with tenderness noted to the clavicle.  She appears to have abnormal gap noted in the [facility] joint.  Concern for [facility] joint separation versus dislocation versus fracture.  Plan for x-rays.  X-ray shows no evidence of fracture or bony lesion.  There is degenerative spurring noted at the [facility] joint.  X-ray of shoulder appears unremarkable.  Discussed results with patient.  Given point tenderness as well as gap noted that is not noted on her right clavicle, will plan to treat as AC joint sprain.  Patient given sling and instructed to follow-up with orthopedics as directed. At this time, patient exhibits no emergent life-threatening condition that require further evaluation in ED or admission. Patient had ample opportunity for questions and discussion. All patient's questions were answered with full  understanding. Strict return precautions discussed. Patient expresses understanding and agreement to plan.   Portions of this note were generated with [employer]. Dictation errors may occur despite best attempts at proofreading.   Final Clinical Impressions(s) / ED Diagnoses   Final diagnoses:  Acute pain of left shoulder    ED Discharge Orders         Ordered    HYDROcodone-acetaminophen (NORCO/VICODIN) 5-325 MG tablet  Every 8 hours PRN,   Status:  Discontinued     03/21/19 1946    HYDROcodone-acetaminophen (NORCO/VICODIN) 5-325 MG tablet  Every 8 hours PRN     03/21/19 1946           Desma Mcgregor 03/21/19 2049    Virgel Manifold, MD 03/22/19 [phone]

## 2019-03-21 NOTE — ED Notes (Signed)
ED Provider at bedside discussing test results and dispo plan of care. 

## 2019-03-22 ENCOUNTER — Telehealth: Payer: Self-pay

## 2019-03-22 NOTE — Telephone Encounter (Signed)
Call to pt re 06/18 and recent ED visit for same c/o.  Pt states she no longer needs appt at clinic.  Appt cancelled.

## 2019-03-23 ENCOUNTER — Ambulatory Visit: Payer: Medicare Other | Admitting: Family Medicine

## 2019-06-21 ENCOUNTER — Encounter: Payer: Self-pay | Admitting: Family Medicine

## 2019-07-27 ENCOUNTER — Encounter: Payer: Self-pay | Admitting: Family Medicine

## 2019-08-30 ENCOUNTER — Encounter: Payer: Self-pay | Admitting: Family Medicine

## 2019-10-26 ENCOUNTER — Encounter: Payer: Self-pay | Admitting: Family Medicine

## 2019-10-30 ENCOUNTER — Other Ambulatory Visit: Payer: Self-pay

## 2019-10-30 ENCOUNTER — Encounter: Payer: Self-pay | Admitting: Family Medicine

## 2019-10-30 ENCOUNTER — Ambulatory Visit (INDEPENDENT_AMBULATORY_CARE_PROVIDER_SITE_OTHER): Payer: Medicare PPO | Admitting: Family Medicine

## 2019-10-30 VITALS — BP 122/79 | HR 85 | Temp 97.2°F | Resp 17 | Ht 68.0 in | Wt 154.0 lb

## 2019-10-30 DIAGNOSIS — Z0001 Encounter for general adult medical examination with abnormal findings: Secondary | ICD-10-CM | POA: Diagnosis not present

## 2019-10-30 DIAGNOSIS — M8589 Other specified disorders of bone density and structure, multiple sites: Secondary | ICD-10-CM | POA: Diagnosis not present

## 2019-10-30 DIAGNOSIS — Z5181 Encounter for therapeutic drug level monitoring: Secondary | ICD-10-CM

## 2019-10-30 DIAGNOSIS — Z0000 Encounter for general adult medical examination without abnormal findings: Secondary | ICD-10-CM

## 2019-10-30 NOTE — Progress Notes (Signed)
QUICK REFERENCE INFORMATION: The ABCs of Providing the Annual Wellness Visit  CMS.gov Medicare Learning Network  [employer] Annual Wellness Visit  Subjective:   Rebecca [name] is a 69 y.o. Female who presents for an Annual Wellness Visit.  Patient Active Problem List   Diagnosis Date Noted  . Osteopenia 02/18/2017  . Lichen planus [id number]  . Anxiety disorder 11/05/2011  . Menopause   . Murmur   . Poikiloderma   . Herpes zoster   . Degenerative disk disease   . Tinnitus   . Colon polyp     Past Medical History:  Diagnosis Date  . Anxiety   . Colon polyp    2007  . Degenerative disk disease    C5-C6  . Depression   . Herpes zoster    2011  . Menopause   . Murmur   . Osteopenia   . Poikiloderma   . Tinnitus    L ear     Past Surgical History:  Procedure Laterality Date  . CESAREAN SECTION     2 times  . COLONOSCOPY    . EYE SURGERY Left 10/20/2017  . HEMORRHOID SURGERY    . TONSILLECTOMY     as a child     Outpatient Medications Prior to Visit  Medication Sig Dispense Refill  . cetirizine (ZYRTEC) 10 MG tablet Take 1 tablet (10 mg total) by mouth daily. (Patient not taking: Reported on 10/30/2019) 30 tablet 11  . fluocinonide (LIDEX) 0.05 % external solution Apply to scalp three times per week.  Do not apply to face, armpit or groin.    Marland Kitchen HYDROcodone-acetaminophen (NORCO/VICODIN) 5-325 MG tablet Take 1-2 tablets by mouth every 8 (eight) hours as needed. (Patient not taking: Reported on 10/30/2019) 6 tablet 0  . venlafaxine XR (EFFEXOR-XR) 37.5 MG 24 hr capsule TAKE 1 CAPSULE BY MOUTH EVERY DAY (Patient not taking: Reported on 10/30/2019) 90 capsule 3   No facility-administered medications prior to visit.    Allergies  Allergen Reactions  . Codeine Hives  . Pnu-Imune [Pneumococcal Polysaccharide Vaccine]     Pneumococcal 23     Fever/Chills/Vomiting/Headache/Redness injection site/Swelling     Family History  Problem Relation Age of Onset  . Cancer  Brother   . Diabetes Maternal Grandmother   . Hearing loss Maternal Grandmother      Social History   Socioeconomic History  . Marital status: Married    Spouse name: Herbie [place]  . Number of children: 2  . Years of education: Not on file  . Highest education level: Some college, no degree  Occupational History  . Occupation: Art therapist: SELF EMPLOYED  Tobacco Use  . Smoking status: Former Smoker    Packs/day: 0.00    Quit date: 11/04/1978    Years since quitting: 41.0  . Smokeless tobacco: Never Used  Substance and Sexual Activity  . Alcohol use: Yes    Alcohol/week: 7.0 standard drinks    Types: 7 Standard drinks or equivalent per week    Comment:  usually 1 drink nightly  . Drug use: No  . Sexual activity: Yes  Other Topics Concern  . Not on file  Social History Narrative  . Not on file   Social Determinants of Health   Financial Resource Strain:   . Difficulty of Paying Living Expenses: Not on file  Food Insecurity:   . Worried About Charity fundraiser in the Last Year: Not on file  . Ran Out of Food in  the Last Year: Not on file  Transportation Needs:   . Lack of Transportation (Medical): Not on file  . Lack of Transportation (Non-Medical): Not on file  Physical Activity:   . Days of Exercise per Week: Not on file  . Minutes of Exercise per Session: Not on file  Stress:   . Feeling of Stress : Not on file  Social Connections:   . Frequency of Communication with Friends and Family: Not on file  . Frequency of Social Gatherings with Friends and Family: Not on file  . Attends Religious Services: Not on file  . Active Member of Clubs or Organizations: Not on file  . Attends Archivist Meetings: Not on file  . Marital Status: Not on file      Recent Hospitalizations? No  Health Habits: Current exercise activities include: walking Exercise: 1 times/week. Diet: in general, a "healthy" diet    Alcohol intake: yes, daily, about  12oz  Health Risk Assessment: The patient has completed a Health Risk Assessment. This has been reveiwed with them and has been scanned into the [place] system as an attached document.  Current Medical Providers and Suppliers: [facility] Patient Care Team: Forrest Moron, MD as PCP - General (Internal Medicine) Ralene Bathe, MD as Consulting Physician (Ophthalmology) Loney Loh, MD (Dermatology) No future appointments.   Age-appropriate Screening Schedule: The list below includes current immunization status and future screening recommendations based on patient's age. Orders for these recommended tests are listed in the plan section. The patient has been provided with a written plan. Immunization History  Administered Date(s) Administered  . DTaP 09/23/2009  . Hepatitis A 09/23/2009, 08/21/2010  . Influenza, High Dose Seasonal PF 08/23/2018  . Influenza,inj,Quad PF,6+ Mos 07/27/2013, 06/05/2016, 08/19/2017  . Influenza-Unspecified 06/05/2017  . Pneumococcal Conjugate-13 03/16/2017  . Pneumococcal Polysaccharide-23 10/28/2018  . Td 10/28/2018  . Zoster 03/06/2011  . Zoster Recombinat (Shingrix) 10/26/2018, 02/22/2019    Health Maintenance reviewed -  See orders,    Depression Screen-PHQ2/9 completed today  Depression screen [facility] 2/9 10/30/2019 12/13/2018 10/28/2018 10/25/2017 08/19/2017  Decreased Interest 0 0 0 0 0  Down, Depressed, Hopeless 0 0 0 0 0  PHQ - 2 Score 0 0 0 0 0       Depression Severity and Treatment Recommendations:  0-4= None  5-9= Mild / Treatment: Support, educate to call if worse; return in one month  10-14= Moderate / Treatment: Support, watchful waiting; Antidepressant or Psycotherapy  15-19= Moderately severe / Treatment: Antidepressant OR Psychotherapy  >= 20 = Major depression, severe / Antidepressant AND Psychotherapy  Functional Status Survey:   Is the patient deaf or have difficulty hearing?: No Does the patient have difficulty seeing,  even when wearing glasses/contacts?: No Does the patient have difficulty concentrating, remembering, or making decisions?: No Does the patient have difficulty walking or climbing stairs?: No Does the patient have difficulty dressing or bathing?: No Does the patient have difficulty doing errands alone such as visiting a doctor's office or shopping?: No    Advanced Care Planning: 1. Patient has executed an Advance Directive: Yes 2. If no, patient was given the opportunity to execute an Advance Directive today? n/a 3. Are the patient's advanced directives in [place]? Yes 4. This patient has the ability to prepare an Advance Directive: Yes 5. Provider is willing to follow the patient's wishes: Yes  Cognitive Assessment: Does the patient have evidence of cognitive impairment? No The patient does not have any evidence of any cognitive  problems and denies any  change in mood/affect, appearance, speech, memory or motor skills.  Identification of Risk Factors: Risk factors include: family history  ROS   Review of Systems  Constitutional: Negative for activity change, appetite change, chills and fever.  HENT: Negative for congestion, nosebleeds, trouble swallowing and voice change.   Respiratory: Negative for cough, shortness of breath and wheezing.   Gastrointestinal: Negative for diarrhea, nausea and vomiting.  Genitourinary: Negative for difficulty urinating, dysuria, flank pain and hematuria.  Musculoskeletal: Negative for back pain, joint swelling and neck pain.  Neurological: Negative for dizziness, speech difficulty, light-headedness and numbness.  See HPI. All other review of systems negative.    Objective:   Vitals:   10/30/19 1453  BP: 122/79  Pulse: 85  Resp: 17  Temp: (!) 97.2 F (36.2 C)  TempSrc: Oral  SpO2: 100%  Weight: 154 lb (69.9 kg)  Height: 5\' 8"  (1.727 m)    Body mass index is 23.42 kg/m.  Physical Exam  Constitutional: Oriented to person, place, and  time. Appears well-developed and well-nourished.  HENT:  Head: Normocephalic and atraumatic.  Eyes: Conjunctivae and EOM are normal.  Cardiovascular: Normal rate, regular rhythm, normal heart sounds and intact distal pulses.  No murmur heard. Pulmonary/Chest: Effort normal and breath sounds normal. No stridor. No respiratory distress. Has no wheezes.  Abdomen: non-distended, normoactive bs, soft, non-tender Neurological: Is alert and oriented to person, place, and time.  Skin: Skin is warm. Capillary refill takes less than 2 seconds.  Psychiatric: Has a normal mood and affect. Behavior is normal. Judgment and thought content normal.     Assessment/Plan:   Patient Self-Management and Personalized Health Advice The patient has been provided with information about:  decrease or avoid alcohol intake and use calcium 1 gram daily with Vit D  During the course of the visit the patient was educated and counseled about appropriate screening and preventive services including:   return annually or prn     Body mass index is 23.42 kg/m. Discussed the patient's BMI with her. The BMI BMI is in the acceptable range  Janiah was seen today for annual exam.  Diagnoses and all orders for this visit:  Encounter for Medicare annual wellness exam- Women's Health Maintenance Plan Advised monthly breast exam and annual mammogram Advised dental exam every six months Discussed stress management Discussed pap smear screening guidelines  Osteopenia of multiple sites- discussed vitamin D and Calcium supplementation  Reviewed DEXA from 06/2019 -     Comprehensive metabolic panel  Encounter for medication monitoring- -     Comprehensive metabolic panel      No follow-ups on file.  No future appointments.  Patient Instructions       If you have lab work done today you will be contacted with your lab results within the next 2 weeks.  If you have not heard from [place] then please contact us. The  fastest way to get your results is to register for My Chart.   IF you received an x-ray today, you will receive an invoice from [facility]. Please contact [facility] at [phone] with questions or concerns regarding your invoice.   IF you received labwork today, you will receive an invoice from [place]. Please contact [facility] at [phone] with questions or concerns regarding your invoice.   Our billing staff will not be able to assist you with questions regarding bills from these companies.  You will be contacted with the lab results as soon as  they are available. The fastest way to get your results is to activate your My Chart account. Instructions are located on the last page of this paperwork. If you have not heard from [place] regarding the results in 2 weeks, please contact this office.       An after visit summary with all of these plans was given to the patient.

## 2019-10-30 NOTE — Patient Instructions (Addendum)
   If you have lab work done today you will be contacted with your lab results within the next 2 weeks.  If you have not heard from us then please contact us. The fastest way to get your results is to register for My Chart.   IF you received an x-ray today, you will receive an invoice from [facility]. Please contact [facility] at [phone] with questions or concerns regarding your invoice.   IF you received labwork today, you will receive an invoice from [facility]. Please contact [facility] at [phone] with questions or concerns regarding your invoice.   Our billing staff will not be able to assist you with questions regarding bills from these companies.  You will be contacted with the lab results as soon as they are available. The fastest way to get your results is to activate your My Chart account. Instructions are located on the last page of this paperwork. If you have not heard from us regarding the results in 2 weeks, please contact this office.     Health Maintenance After Age 65 After age 69, you are at a higher risk for certain long-term diseases and infections as well as injuries from falls. Falls are a major cause of broken bones and head injuries in people who are older than age 69. Getting regular preventive care can help to keep you healthy and well. Preventive care includes getting regular testing and making lifestyle changes as recommended by your health care provider. Talk with your health care provider about:  Which screenings and tests you should have. A screening is a test that checks for a disease when you have no symptoms.  A diet and exercise plan that is right for you. What should I know about screenings and tests to prevent falls? Screening and testing are the best ways to find a health problem early. Early diagnosis and treatment give you the best chance of managing medical conditions that are common after age 69. Certain conditions and  lifestyle choices may make you more likely to have a fall. Your health care provider may recommend:  Regular vision checks. Poor vision and conditions such as cataracts can make you more likely to have a fall. If you wear glasses, make sure to get your prescription updated if your vision changes.  Medicine review. Work with your health care provider to regularly review all of the medicines you are taking, including over-the-counter medicines. Ask your health care provider about any side effects that may make you more likely to have a fall. Tell your health care provider if any medicines that you take make you feel dizzy or sleepy.  Osteoporosis screening. Osteoporosis is a condition that causes the bones to get weaker. This can make the bones weak and cause them to break more easily.  Blood pressure screening. Blood pressure changes and medicines to control blood pressure can make you feel dizzy.  Strength and balance checks. Your health care provider may recommend certain tests to check your strength and balance while standing, walking, or changing positions.  Foot health exam. Foot pain and numbness, as well as not wearing proper footwear, can make you more likely to have a fall.  Depression screening. You may be more likely to have a fall if you have a fear of falling, feel emotionally low, or feel unable to do activities that you used to do.  Alcohol use screening. Using too much alcohol can affect your balance and may make you more likely to   have a fall. What actions can I take to lower my risk of falls? General instructions  Talk with your health care provider about your risks for falling. Tell your health care provider if: ? You fall. Be sure to tell your health care provider about all falls, even ones that seem minor. ? You feel dizzy, sleepy, or off-balance.  Take over-the-counter and prescription medicines only as told by your health care provider. These include any  supplements.  Eat a healthy diet and maintain a healthy weight. A healthy diet includes low-fat dairy products, low-fat (lean) meats, and fiber from whole grains, beans, and lots of fruits and vegetables. Home safety  Remove any tripping hazards, such as rugs, cords, and clutter.  Install safety equipment such as grab bars in bathrooms and safety rails on stairs.  Keep rooms and walkways well-lit. Activity   Follow a regular exercise program to stay fit. This will help you maintain your balance. Ask your health care provider what types of exercise are appropriate for you.  If you need a cane or walker, use it as recommended by your health care provider.  Wear supportive shoes that have nonskid soles. Lifestyle  Do not drink alcohol if your health care provider tells you not to drink.  If you drink alcohol, limit how much you have: ? 0-1 drink a day for women. ? 0-2 drinks a day for men.  Be aware of how much alcohol is in your drink. In the [place] one drink equals one typical bottle of beer (12 oz), one-half glass of wine (5 oz), or one shot of hard liquor (1 oz).  Do not use any products that contain nicotine or tobacco, such as cigarettes and e-cigarettes. If you need help quitting, ask your health care provider. Summary  Having a healthy lifestyle and getting preventive care can help to protect your health and wellness after age 69.  Screening and testing are the best way to find a health problem early and help you avoid having a fall. Early diagnosis and treatment give you the best chance for managing medical conditions that are more common for people who are older than age 69.  Falls are a major cause of broken bones and head injuries in people who are older than age 69. Take precautions to prevent a fall at home.  Work with your health care provider to learn what changes you can make to improve your health and wellness and to prevent falls. This information is not intended  to replace advice given to you by your health care provider. Make sure you discuss any questions you have with your health care provider. Document Revised: 01/12/2019 Document Reviewed: 08/04/2017 Elsevier Patient Education  2020 Elsevier Inc.  

## 2019-10-31 LAB — COMPREHENSIVE METABOLIC PANEL
ALT: 13 IU/L (ref 0–32)
AST: 17 IU/L (ref 0–40)
Albumin/Globulin Ratio: 2.1 (ref 1.2–2.2)
Albumin: 4.4 g/dL (ref 3.8–4.8)
Alkaline Phosphatase: 61 IU/L (ref 39–117)
BUN/Creatinine Ratio: 15 (ref 12–28)
BUN: 16 mg/dL (ref 8–27)
Bilirubin Total: <0.2 mg/dL (ref 0.0–1.2)
CO2: 24 mmol/L (ref 20–29)
Calcium: 9.4 mg/dL (ref 8.7–10.3)
Chloride: 103 mmol/L (ref 96–106)
Creatinine, Ser: 1.04 mg/dL — ABNORMAL HIGH (ref 0.57–1.00)
GFR calc Af Amer: 63 mL/min/{1.73_m2} (ref >59)
GFR calc non Af Amer: 55 mL/min/{1.73_m2} — ABNORMAL LOW (ref >59)
Globulin, Total: 2.1 g/dL (ref 1.5–4.5)
Glucose: 96 mg/dL (ref 65–99)
Potassium: 4.2 mmol/L (ref 3.5–5.2)
Sodium: 139 mmol/L (ref 134–144)
Total Protein: 6.5 g/dL (ref 6.0–8.5)

## 2020-02-28 IMAGING — DX LEFT SHOULDER - 2+ VIEW
3 series · 3 of 3 positions shown · non-contrast
Comparison: None.

CLINICAL DATA: Trip and fall on uneven pavement with left clavicle
and shoulder pain.

EXAM:
LEFT SHOULDER - 2+ VIEW

[shoulder grashey]
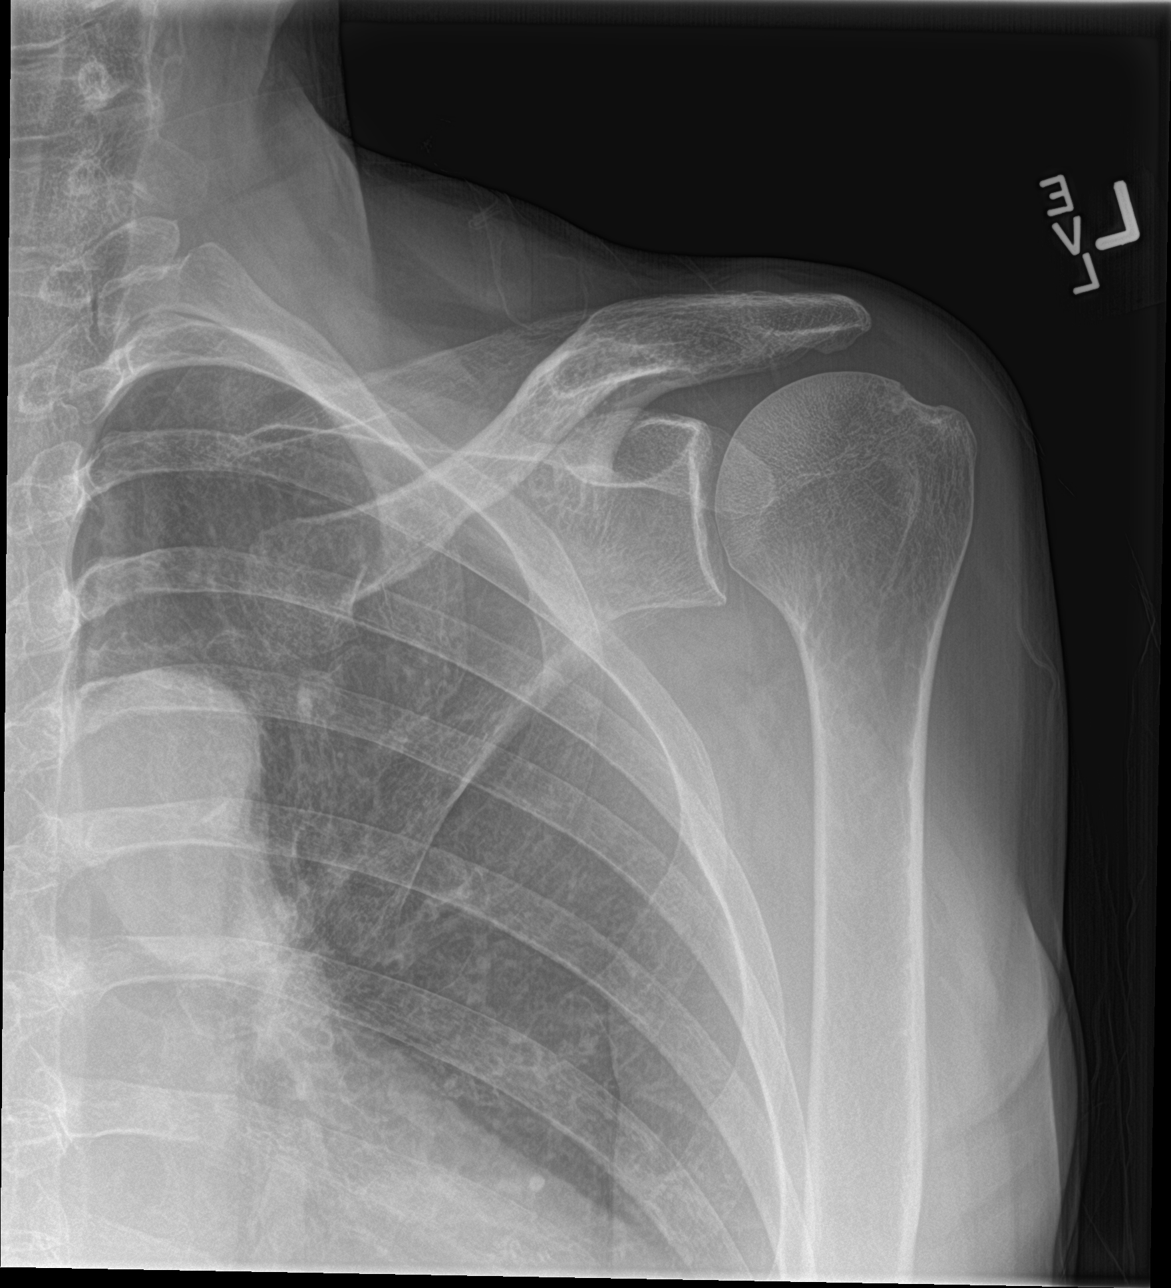

[shoulder y view]
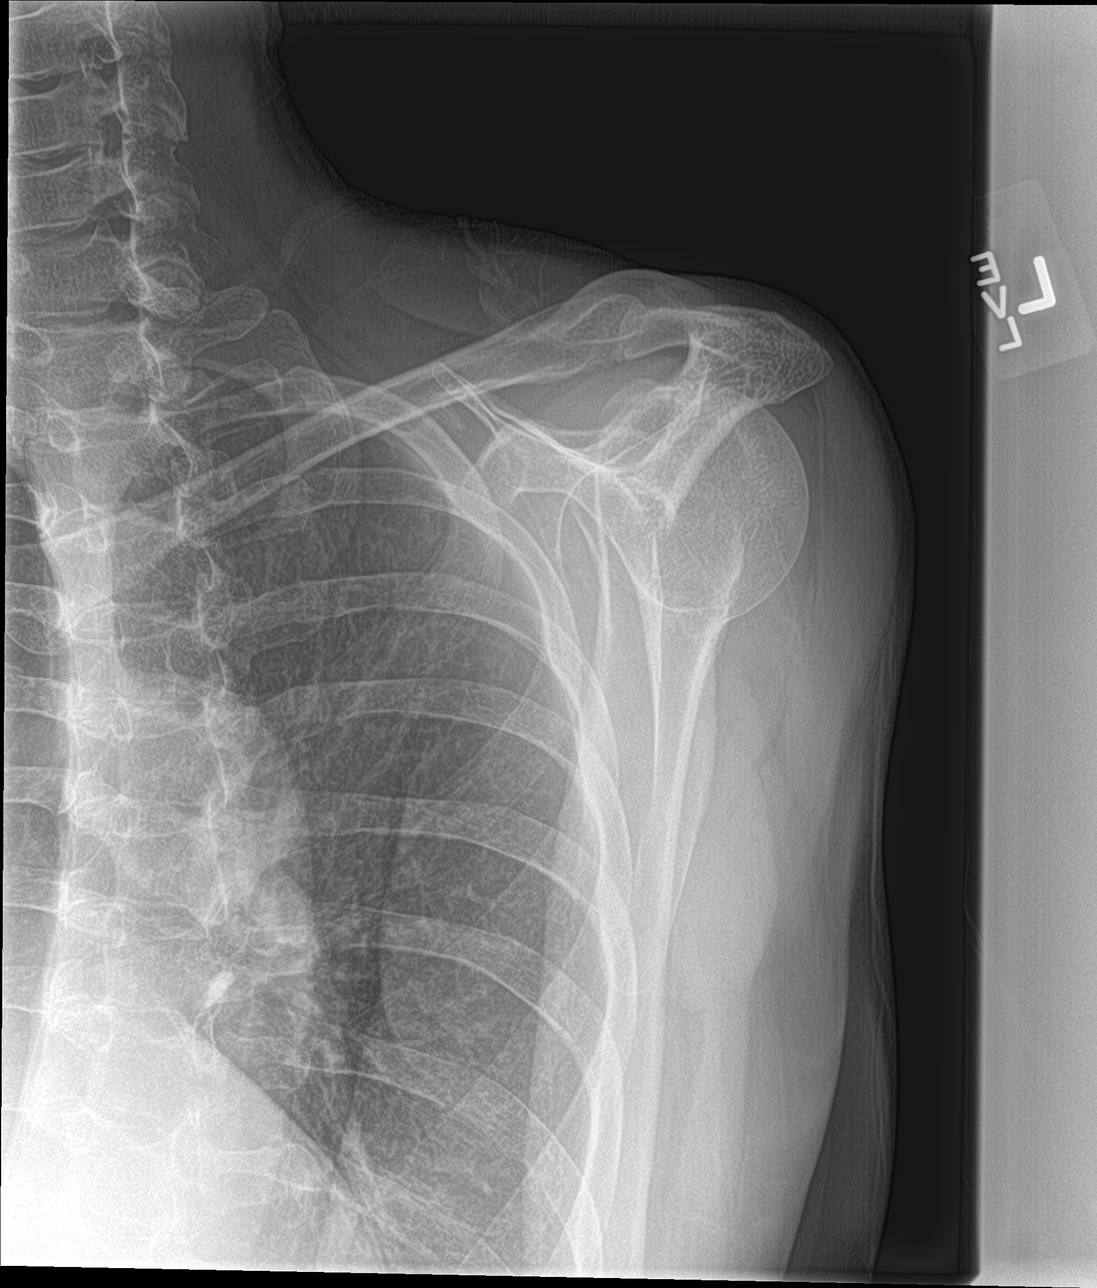

[shoulder axillary]
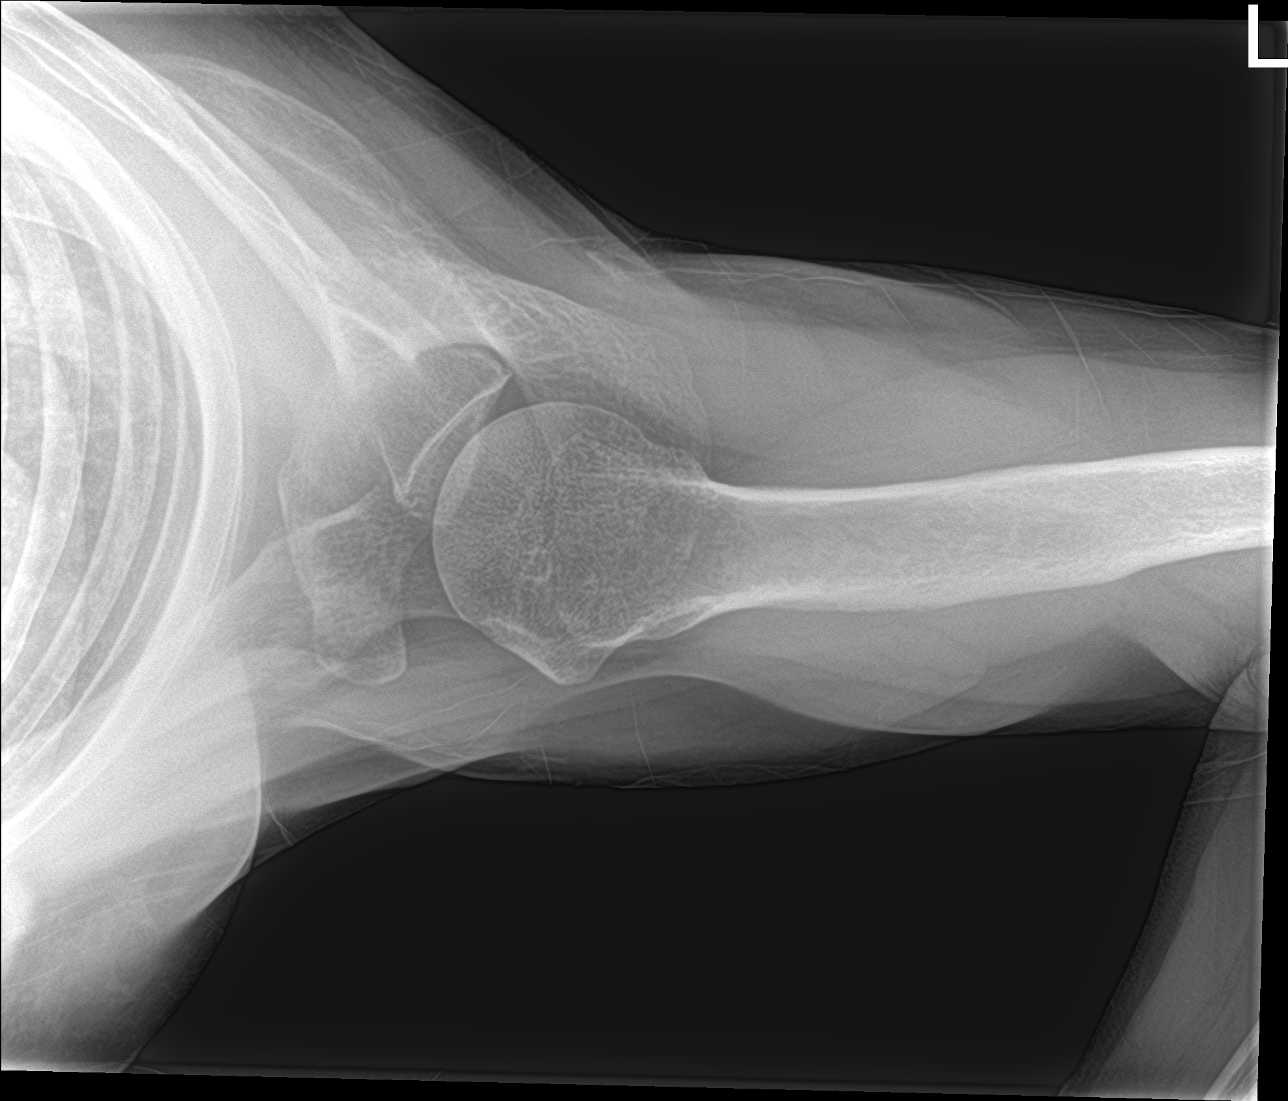

[3 of 3 positions shown; findings below may reference images not displayed]

FINDINGS: There is no evidence of fracture or dislocation. Trace degenerative
acromioclavicular and glenohumeral spurring. There is no evidence of
arthropathy or other focal bone abnormality. Soft tissues are
unremarkable.
IMPRESSION: No acute fracture or subluxation of the left shoulder. Minor
degenerative change.

## 2020-03-11 ENCOUNTER — Telehealth: Payer: Self-pay | Admitting: Family Medicine

## 2020-03-11 DIAGNOSIS — N951 Menopausal and female climacteric states: Secondary | ICD-10-CM

## 2020-03-11 NOTE — Telephone Encounter (Signed)
Copied from [place] [id number]. Topic: Referral - Request for Referral >> Mar 11, 2020 10:50 AM Richardo Priest, NT wrote: Has patient seen PCP for this complaint? yes *If NO, is insurance requiring patient see PCP for this issue before PCP can refer them? Referral for which specialty: Endocrinology  Preferred provider/office: [facility]/ [place]  Reason for referral: patient stated she wanted to discuss with Endo MD

## 2020-03-11 NOTE — Telephone Encounter (Signed)
The patient wants a referral to endocrinology Dr Pecola Lawless for post  menopausal changes. Pended referral for review and approval .

## 2020-03-11 NOTE — Telephone Encounter (Signed)
Pt got this message and would like a cb. Please adviise at [phone].

## 2020-03-11 NOTE — Telephone Encounter (Signed)
LVM for return call with more information on referral request information.

## 2020-03-12 NOTE — Telephone Encounter (Signed)
Please let patient know that referral was made as requested.

## 2020-03-12 NOTE — Telephone Encounter (Signed)
Please let patient know that obgyn and not endo take care of postmenopausal symptoms. I am happy to make a referral to an obgyn if she does not have one or she can come see me and we can have a conversation about her concerns and possible next steps. thanks

## 2020-03-12 NOTE — Telephone Encounter (Signed)
Spoke with pt and I told her that OBGYN will hand the post menopausal concerns that she has. Pt stated that she does not want to go to obgyn for her own reasons and stated that she would like to be in the hands of Endo for her problems. I told her that you are more than welcome to discuss any concerns that she may have but she still prefer Endo.  Please Advise

## 2020-03-13 NOTE — Telephone Encounter (Signed)
LVM to let pt know that referral has been placed to Endo

## 2020-03-18 ENCOUNTER — Telehealth: Payer: Self-pay | Admitting: Family Medicine

## 2020-03-18 NOTE — Telephone Encounter (Signed)
I have called pt and relayed message about the referral. Pt stated understanding and is not needing the referral as of yet, she will make other arrangements.  Thanks.  Verbal given to provider.

## 2020-03-18 NOTE — Telephone Encounter (Signed)
Please let patient know that  Dr. Pamella Pert: Dr. Lesle Chris declined the Endo referral. She stated she only treat premature menopause, and that regular menopause is best treated by OB/GYN, in her opinion. If you would like to send the patient to a GYN, a new referral will have to be sent. Thank you

## 2024-04-19 ENCOUNTER — Other Ambulatory Visit: Payer: Self-pay | Admitting: Medical Genetics

## 2024-08-04 ENCOUNTER — Other Ambulatory Visit: Payer: Self-pay | Admitting: Medical Genetics

## 2024-08-04 DIAGNOSIS — Z006 Encounter for examination for normal comparison and control in clinical research program: Secondary | ICD-10-CM
